# Patient Record
Sex: Male | Born: 1985 | Race: White | Hispanic: No | Marital: Single | Smoking: Current every day smoker
Health system: Southern US, Community
[De-identification: ages and names within clinical notes are randomized; demographics above are authoritative.]

## PROBLEM LIST (undated history)

## (undated) DIAGNOSIS — F1910 Other psychoactive substance abuse, uncomplicated: Secondary | ICD-10-CM

## (undated) DIAGNOSIS — F4310 Post-traumatic stress disorder, unspecified: Secondary | ICD-10-CM

## (undated) DIAGNOSIS — W3400XA Accidental discharge from unspecified firearms or gun, initial encounter: Secondary | ICD-10-CM

## (undated) DIAGNOSIS — N319 Neuromuscular dysfunction of bladder, unspecified: Secondary | ICD-10-CM

## (undated) DIAGNOSIS — F319 Bipolar disorder, unspecified: Secondary | ICD-10-CM

## (undated) DIAGNOSIS — F419 Anxiety disorder, unspecified: Secondary | ICD-10-CM

## (undated) HISTORY — PX: WRIST SURGERY: SHX841

## (undated) HISTORY — PX: HERNIA REPAIR: SHX51

## (undated) HISTORY — PX: CYSTOSCOPY: SUR368

---
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
## 2007-02-21 ENCOUNTER — Emergency Department: Payer: Self-pay | Admitting: Emergency Medicine

## 2007-04-16 ENCOUNTER — Emergency Department: Payer: Self-pay | Admitting: Emergency Medicine

## 2007-06-08 ENCOUNTER — Emergency Department: Payer: Self-pay | Admitting: Emergency Medicine

## 2007-08-02 ENCOUNTER — Emergency Department: Payer: Self-pay | Admitting: Internal Medicine

## 2007-09-24 ENCOUNTER — Other Ambulatory Visit: Payer: Self-pay

## 2007-09-24 ENCOUNTER — Emergency Department: Payer: Self-pay | Admitting: Emergency Medicine

## 2007-11-13 ENCOUNTER — Emergency Department: Payer: Self-pay | Admitting: Emergency Medicine

## 2007-11-18 ENCOUNTER — Emergency Department: Payer: Self-pay | Admitting: Emergency Medicine

## 2008-01-25 ENCOUNTER — Other Ambulatory Visit: Payer: Self-pay

## 2008-01-25 ENCOUNTER — Emergency Department: Payer: Self-pay | Admitting: Emergency Medicine

## 2008-03-06 ENCOUNTER — Emergency Department: Payer: Self-pay | Admitting: Emergency Medicine

## 2008-03-16 ENCOUNTER — Emergency Department: Payer: Self-pay | Admitting: Emergency Medicine

## 2008-03-19 ENCOUNTER — Emergency Department: Payer: Self-pay | Admitting: Emergency Medicine

## 2008-04-02 ENCOUNTER — Emergency Department: Payer: Self-pay | Admitting: Emergency Medicine

## 2010-02-16 ENCOUNTER — Emergency Department: Payer: Self-pay | Admitting: Emergency Medicine

## 2010-04-24 ENCOUNTER — Emergency Department: Payer: Self-pay | Admitting: Emergency Medicine

## 2010-04-30 ENCOUNTER — Ambulatory Visit: Payer: Self-pay | Admitting: Urology

## 2010-05-08 ENCOUNTER — Emergency Department: Payer: Self-pay | Admitting: Emergency Medicine

## 2010-05-09 ENCOUNTER — Ambulatory Visit: Payer: Self-pay | Admitting: Emergency Medicine

## 2010-06-28 ENCOUNTER — Emergency Department: Payer: Self-pay | Admitting: Emergency Medicine

## 2010-07-03 ENCOUNTER — Emergency Department: Payer: Self-pay | Admitting: Emergency Medicine

## 2010-07-07 ENCOUNTER — Emergency Department: Payer: Self-pay | Admitting: Emergency Medicine

## 2010-07-16 ENCOUNTER — Ambulatory Visit: Payer: Self-pay | Admitting: Urology

## 2010-07-19 ENCOUNTER — Emergency Department: Payer: Self-pay | Admitting: Emergency Medicine

## 2010-08-05 ENCOUNTER — Emergency Department: Payer: Self-pay | Admitting: Emergency Medicine

## 2010-08-11 ENCOUNTER — Emergency Department: Payer: Self-pay | Admitting: Emergency Medicine

## 2010-08-17 ENCOUNTER — Emergency Department: Payer: Self-pay | Admitting: Emergency Medicine

## 2010-08-24 ENCOUNTER — Emergency Department: Payer: Self-pay | Admitting: Emergency Medicine

## 2010-09-03 ENCOUNTER — Emergency Department: Payer: Self-pay | Admitting: Internal Medicine

## 2010-09-09 ENCOUNTER — Emergency Department: Payer: Self-pay | Admitting: Emergency Medicine

## 2010-09-17 ENCOUNTER — Ambulatory Visit: Payer: Self-pay | Admitting: Urology

## 2010-09-23 ENCOUNTER — Emergency Department: Payer: Self-pay | Admitting: Emergency Medicine

## 2011-01-10 ENCOUNTER — Emergency Department: Payer: Self-pay | Admitting: Emergency Medicine

## 2011-01-17 ENCOUNTER — Emergency Department: Payer: Self-pay | Admitting: Emergency Medicine

## 2011-02-03 ENCOUNTER — Emergency Department: Payer: Self-pay | Admitting: Unknown Physician Specialty

## 2011-02-10 ENCOUNTER — Emergency Department: Payer: Self-pay | Admitting: *Deleted

## 2011-02-14 ENCOUNTER — Emergency Department: Payer: Self-pay | Admitting: Emergency Medicine

## 2011-02-18 ENCOUNTER — Ambulatory Visit: Payer: Self-pay | Admitting: Unknown Physician Specialty

## 2011-06-26 ENCOUNTER — Emergency Department: Payer: Self-pay | Admitting: Emergency Medicine

## 2011-10-13 ENCOUNTER — Emergency Department: Payer: Self-pay | Admitting: Emergency Medicine

## 2011-12-10 ENCOUNTER — Emergency Department: Payer: Self-pay | Admitting: Emergency Medicine

## 2011-12-11 LAB — URINALYSIS, COMPLETE

## 2011-12-12 ENCOUNTER — Inpatient Hospital Stay: Payer: Self-pay | Admitting: Psychiatry

## 2011-12-12 LAB — CBC
HGB: 14.3 g/dL (ref 13.0–18.0)
MCH: 28.5 pg (ref 26.0–34.0)
Platelet: 236 10*3/uL (ref 150–440)
RBC: 5.02 10*6/uL (ref 4.40–5.90)

## 2011-12-12 LAB — URINALYSIS, COMPLETE
Glucose,UR: NEGATIVE mg/dL (ref 0–75)
Leukocyte Esterase: NEGATIVE
Ph: 6 (ref 4.5–8.0)
Specific Gravity: 1.03 (ref 1.003–1.030)
WBC UR: NONE SEEN /HPF (ref 0–5)

## 2011-12-12 LAB — COMPREHENSIVE METABOLIC PANEL
Alkaline Phosphatase: 78 U/L (ref 50–136)
BUN: 13 mg/dL (ref 7–18)
Bilirubin,Total: 0.7 mg/dL (ref 0.2–1.0)
Calcium, Total: 8.6 mg/dL (ref 8.5–10.1)
Chloride: 107 mmol/L (ref 98–107)
Creatinine: 0.91 mg/dL (ref 0.60–1.30)
Potassium: 4 mmol/L (ref 3.5–5.1)
SGPT (ALT): 276 U/L — ABNORMAL HIGH

## 2011-12-12 LAB — ETHANOL: Ethanol: <3 mg/dL

## 2011-12-12 LAB — DRUG SCREEN, URINE
Cannabinoid 50 Ng, Ur ~~LOC~~: POSITIVE (ref ?–50)
Tricyclic, Ur Screen: NEGATIVE (ref ?–1000)

## 2011-12-12 LAB — SALICYLATE LEVEL: Salicylates, Serum: 2.9 mg/dL — ABNORMAL HIGH

## 2011-12-31 ENCOUNTER — Emergency Department: Payer: Self-pay | Admitting: *Deleted

## 2012-01-02 ENCOUNTER — Emergency Department (HOSPITAL_COMMUNITY)
Admission: EM | Admit: 2012-01-02 | Discharge: 2012-01-02 | Disposition: A | Discharge status: Home / Self Care | Payer: Self-pay | Attending: Emergency Medicine | Admitting: Emergency Medicine

## 2012-01-02 ENCOUNTER — Encounter (HOSPITAL_COMMUNITY): Payer: Self-pay | Admitting: Emergency Medicine

## 2012-01-02 ENCOUNTER — Emergency Department (HOSPITAL_COMMUNITY): Payer: Self-pay

## 2012-01-02 DIAGNOSIS — IMO0002 Reserved for concepts with insufficient information to code with codable children: Secondary | ICD-10-CM | POA: Insufficient documentation

## 2012-01-02 DIAGNOSIS — R110 Nausea: Secondary | ICD-10-CM | POA: Insufficient documentation

## 2012-01-02 DIAGNOSIS — S301XXA Contusion of abdominal wall, initial encounter: Secondary | ICD-10-CM | POA: Insufficient documentation

## 2012-01-02 DIAGNOSIS — R1011 Right upper quadrant pain: Secondary | ICD-10-CM | POA: Insufficient documentation

## 2012-01-02 LAB — COMPREHENSIVE METABOLIC PANEL
ALT: 421 U/L — ABNORMAL HIGH (ref 0–53)
Alkaline Phosphatase: 95 U/L (ref 39–117)
BUN: 12 mg/dL (ref 6–23)
CO2: 21 mEq/L (ref 19–32)
GFR calc Af Amer: >90 mL/min (ref >90)
GFR calc non Af Amer: >90 mL/min (ref >90)
Glucose, Bld: 103 mg/dL — ABNORMAL HIGH (ref 70–99)
Potassium: 4.3 mEq/L (ref 3.5–5.1)
Sodium: 139 mEq/L (ref 135–145)

## 2012-01-02 LAB — URINALYSIS, ROUTINE W REFLEX MICROSCOPIC
Bilirubin Urine: NEGATIVE
Ketones, ur: NEGATIVE mg/dL
Nitrite: NEGATIVE
Protein, ur: NEGATIVE mg/dL
Urobilinogen, UA: 0.2 mg/dL (ref 0.0–1.0)

## 2012-01-02 LAB — RAPID URINE DRUG SCREEN, HOSP PERFORMED: Barbiturates: NOT DETECTED

## 2012-01-02 LAB — DIFFERENTIAL
Eosinophils Relative: 1 % (ref 0–5)
Lymphocytes Relative: 20 % (ref 12–46)
Lymphs Abs: 2.1 10*3/uL (ref 0.7–4.0)
Monocytes Relative: 8 % (ref 3–12)
Neutrophils Relative %: 70 % (ref 43–77)

## 2012-01-02 LAB — CBC
Hemoglobin: 14.7 g/dL (ref 13.0–17.0)
MCH: 30.8 pg (ref 26.0–34.0)
MCV: 87 fL (ref 78.0–100.0)
Platelets: 232 10*3/uL (ref 150–400)
RBC: 4.78 MIL/uL (ref 4.22–5.81)
WBC: 10.3 10*3/uL (ref 4.0–10.5)

## 2012-01-02 LAB — URINE MICROSCOPIC-ADD ON

## 2012-01-02 MED ORDER — SODIUM CHLORIDE 0.9 % IV SOLN
Freq: Once | INTRAVENOUS | Status: AC
  Administered 2012-01-02: 04:00:00 via INTRAVENOUS

## 2012-01-02 MED ORDER — IOHEXOL 300 MG/ML  SOLN
100.0000 mL | Freq: Once | INTRAMUSCULAR | Status: AC | PRN
  Administered 2012-01-02: 100 mL via INTRAVENOUS

## 2012-01-02 MED ORDER — HYDROMORPHONE HCL PF 1 MG/ML IJ SOLN
1.0000 mg | Freq: Once | INTRAMUSCULAR | Status: AC
  Administered 2012-01-02: 1 mg via INTRAVENOUS
  Filled 2012-01-02: qty 1

## 2012-01-02 NOTE — ED Notes (Signed)
Patient transported to CT 

## 2012-01-02 NOTE — ED Notes (Signed)
Patient is resting comfortably. 

## 2012-01-02 NOTE — ED Notes (Signed)
PT. REPORTS KICKED BY A BULL THIS MORNING WHILE FEEDING THEM , PRESENTS WITH PAIN AT RUQ/ RIGHT BACK PAIN WITH HEMATURIA.

## 2012-01-02 NOTE — ED Notes (Signed)
Pt ambulated with a steady gait; VSS; A&Ox3; no signs of distress; pt instructed to wait in waiting room for 4 hours until he is able to drive; respirations even and unlabored; skin warm and dry. NO questions at this time.

## 2012-01-02 NOTE — Discharge Instructions (Signed)
Blunt Abdominal Trauma  A blunt injury to the abdomen can cause pain. The pain is most likely from bruising and stretching of your muscles. This pain is often made worse with movement. Most often these injuries are not serious and get better within 1 week with rest and mild pain medicine. However, internal organs (liver, spleen, kidneys) can be injured with blunt trauma. If you do not get better or if you get worse, further examination may be needed.  Continue with your regular daily activities, but avoid any strenuous activities until your pain is improved. If your stomach is upset, stick to a clear liquid diet and slowly advance to solid food.   SEEK IMMEDIATE MEDICAL CARE IF:    You develop increasing pain, nausea, or repeated vomiting.   You develop chest pain or breathing difficulty.   You develop blood in the urine, vomit, or stool.   You develop weakness, fainting, fever, or other serious complaints.  Document Released: 08/14/2004 Document Revised: 06/26/2011 Document Reviewed: 11/30/2008  [employer] Patient Information 2012 [employer].

## 2012-01-02 NOTE — ED Provider Notes (Addendum)
History     CSN: [id number]  Arrival date & time 01/02/12  3664   First MD Initiated Contact with Patient 01/02/12 0354      Chief Complaint  Patient presents with  . Abdominal Pain    (Consider location/radiation/quality/duration/timing/severity/associated sxs/prior treatment) HPI Comments: Patient is a [employer].  States he was transporting a load of cattle and stopped to feed them when a bull kicked him and pinned him between it and a stablizer bar.  He has been having pain in the right side of the abdomen and right flank and urinating blood since that time.  Patient is a 26 y.o. male presenting with abdominal pain. The history is provided by the patient.  Abdominal Pain The primary symptoms of the illness include abdominal pain and nausea. The primary symptoms of the illness do not include vomiting. Primary symptoms comment: abd/flank pain.  Kicked by bull. The current episode started less than 1 hour ago. The onset of the illness was sudden. The problem has been gradually worsening.  The patient has not had a change in bowel habit.    History reviewed. No pertinent past medical history.  History reviewed. No pertinent past surgical history.  No family history on file.  History  Substance Use Topics  . Smoking status: Current Everyday Smoker  . Smokeless tobacco: Not on file  . Alcohol Use: No      Review of Systems  Gastrointestinal: Positive for nausea and abdominal pain. Negative for vomiting.  All other systems reviewed and are negative.    Allergies  Review of patient's allergies indicates not on file.  Home Medications  No current outpatient prescriptions on file.  BP 124/82  Pulse 122  Temp 98.2 F (36.8 C) (Oral)  Resp 18  SpO2 99%  Physical Exam  Nursing note and vitals reviewed. Constitutional: He appears well-developed and well-nourished.  HENT:  Head: Normocephalic and atraumatic.  Neck: Normal range of motion. Neck supple.    Cardiovascular: Normal rate and regular rhythm.   No murmur heard. Pulmonary/Chest: Effort normal. No respiratory distress. He has no wheezes.  Abdominal: Soft. Bowel sounds are normal. There is tenderness.       ttp in the right mid abdomen and flank.  Musculoskeletal: Normal range of motion. He exhibits no edema.  Neurological: He is alert.  Skin: Skin is warm and dry.    ED Course  Procedures (including critical care time)   Labs Reviewed  CBC  DIFFERENTIAL  COMPREHENSIVE METABOLIC PANEL  URINALYSIS, ROUTINE W REFLEX MICROSCOPIC   No results found.   No diagnosis found.    MDM  The patient presents after a "kick from a bull".  He is having pain in the abdomen and flank and reports that he is urinating blood.  He reports severe pain and was given a dose of dilaudid for pain.  On his exam there is no evidence of any trauma and on the ct there is no reason for the pain or blood in urine he is reporting.  It seems to me that if he had been struck in the abdomen with a bull with enough force to cause hematuria, there should at least be an abrasion or contusion which he does not have.  I strongly suspect he is not being forthright with me and that the blood in the urine is factitious.  He will be discharged to home with ibuprofen or tylenol for pain, to return prn.  Geoffery Lyons, MD 01/02/12 4540  Geoffery Lyons, MD 01/02/12 [phone]

## 2012-01-02 NOTE — ED Notes (Signed)
CT scan called re delay in scan; reported they were waiting on labs to give contrast.

## 2012-01-12 ENCOUNTER — Emergency Department: Payer: Self-pay | Admitting: Emergency Medicine

## 2012-03-11 ENCOUNTER — Emergency Department: Payer: Self-pay | Admitting: *Deleted

## 2012-06-01 ENCOUNTER — Emergency Department: Payer: Self-pay | Admitting: Emergency Medicine

## 2012-06-02 LAB — BASIC METABOLIC PANEL
EGFR (Non-African Amer.): >60
Glucose: 91 mg/dL (ref 65–99)
Osmolality: 272 (ref 275–301)
Potassium: 3.5 mmol/L (ref 3.5–5.1)

## 2012-06-02 LAB — CBC
HCT: 40 % (ref 40.0–52.0)
HGB: 13.4 g/dL (ref 13.0–18.0)
MCHC: 33.6 g/dL (ref 32.0–36.0)
RBC: 4.75 10*6/uL (ref 4.40–5.90)
WBC: 9.6 10*3/uL (ref 3.8–10.6)

## 2012-06-12 ENCOUNTER — Emergency Department: Payer: Self-pay | Admitting: Emergency Medicine

## 2012-06-13 LAB — BASIC METABOLIC PANEL
BUN: 9 mg/dL (ref 7–18)
EGFR (African American): >60
EGFR (Non-African Amer.): >60
Glucose: 89 mg/dL (ref 65–99)
Osmolality: 268 (ref 275–301)
Potassium: 3.2 mmol/L — ABNORMAL LOW (ref 3.5–5.1)

## 2012-06-13 LAB — CBC
HCT: 41.2 % (ref 40.0–52.0)
RDW: 14.1 % (ref 11.5–14.5)
WBC: 8.8 10*3/uL (ref 3.8–10.6)

## 2012-06-29 ENCOUNTER — Emergency Department: Payer: Self-pay | Admitting: Emergency Medicine

## 2013-02-15 ENCOUNTER — Emergency Department: Payer: Self-pay | Admitting: Emergency Medicine

## 2013-02-16 LAB — BASIC METABOLIC PANEL
Anion Gap: 5 — ABNORMAL LOW (ref 7–16)
BUN: 12 mg/dL (ref 7–18)
Co2: 29 mmol/L (ref 21–32)
EGFR (African American): >60
EGFR (Non-African Amer.): >60
Osmolality: 278 (ref 275–301)
Potassium: 3.6 mmol/L (ref 3.5–5.1)

## 2013-02-16 LAB — CBC WITH DIFFERENTIAL/PLATELET
Basophil #: 0.1 10*3/uL (ref 0.0–0.1)
Eosinophil %: 0.5 %
HGB: 14.7 g/dL (ref 13.0–18.0)
Lymphocyte #: 3.8 10*3/uL — ABNORMAL HIGH (ref 1.0–3.6)
Lymphocyte %: 34.2 %
MCHC: 35.2 g/dL (ref 32.0–36.0)
MCV: 87 fL (ref 80–100)
Monocyte %: 8.1 %
Neutrophil #: 6.3 10*3/uL (ref 1.4–6.5)
Platelet: 206 10*3/uL (ref 150–440)
RDW: 13.5 % (ref 11.5–14.5)
WBC: 11.2 10*3/uL — ABNORMAL HIGH (ref 3.8–10.6)

## 2013-02-21 LAB — CULTURE, BLOOD (SINGLE)

## 2013-03-26 ENCOUNTER — Emergency Department: Payer: Self-pay | Admitting: Emergency Medicine

## 2013-03-26 LAB — CBC WITH DIFFERENTIAL/PLATELET
Basophil #: 0.1 10*3/uL (ref 0.0–0.1)
Basophil %: 0.9 %
Eosinophil #: 0.2 10*3/uL (ref 0.0–0.7)
Eosinophil %: 1.9 %
HGB: 15.3 g/dL (ref 13.0–18.0)
Lymphocyte #: 3.9 10*3/uL — ABNORMAL HIGH (ref 1.0–3.6)
Lymphocyte %: 40 %
MCH: 30.8 pg (ref 26.0–34.0)
MCHC: 35.6 g/dL (ref 32.0–36.0)
MCV: 87 fL (ref 80–100)
Monocyte %: 9.1 %
Neutrophil #: 4.6 10*3/uL (ref 1.4–6.5)
Platelet: 212 10*3/uL (ref 150–440)
WBC: 9.6 10*3/uL (ref 3.8–10.6)

## 2013-03-26 LAB — COMPREHENSIVE METABOLIC PANEL
Albumin: 4.2 g/dL (ref 3.4–5.0)
Alkaline Phosphatase: 95 U/L (ref 50–136)
Anion Gap: 4 — ABNORMAL LOW (ref 7–16)
Bilirubin,Total: 0.4 mg/dL (ref 0.2–1.0)
Calcium, Total: 9.1 mg/dL (ref 8.5–10.1)
Chloride: 106 mmol/L (ref 98–107)
Glucose: 89 mg/dL (ref 65–99)
Potassium: 3.7 mmol/L (ref 3.5–5.1)
Sodium: 139 mmol/L (ref 136–145)

## 2013-05-23 IMAGING — US US PELVIS LIMITED
1 series · 17 of 24 positions shown · non-contrast
Comparison: none

REASON FOR EXAM: Possible catheter broke off in bladder approx 2 hr ago
COMMENTS:   LMP: (Male)

[Series 1: us pelvis limited · 17 of 24 slices shown]
[im 1/24]
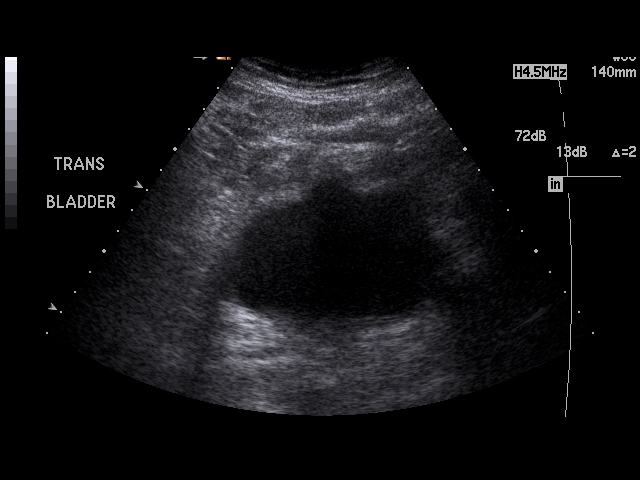
[im 3/24]
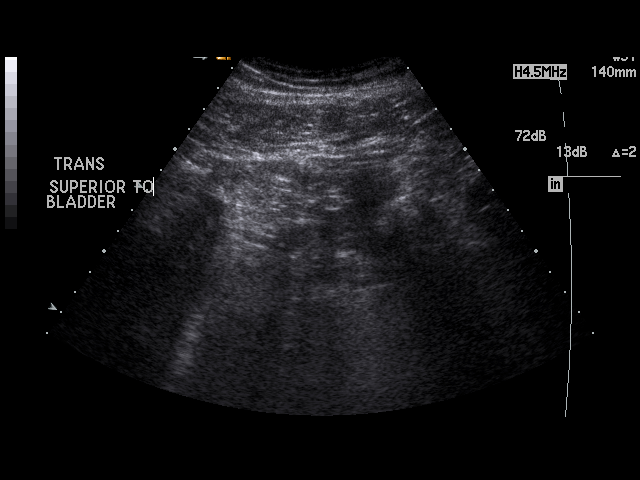
[im 4/24]
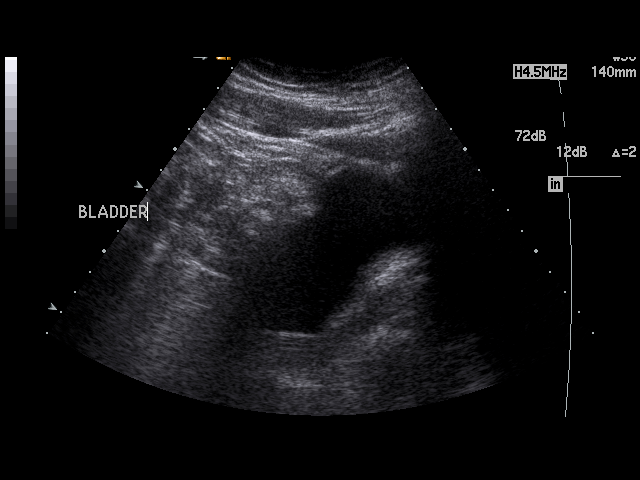
[im 5/24]
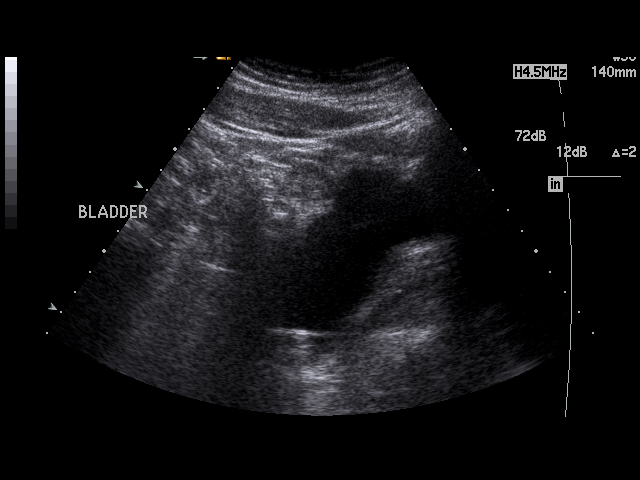
[im 7/24]
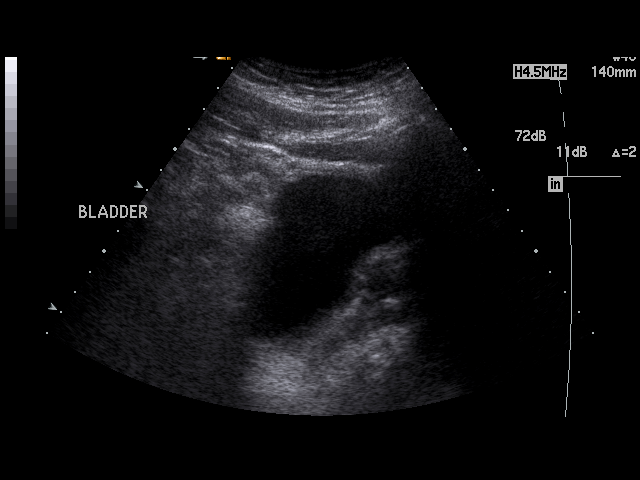
[im 8/24]
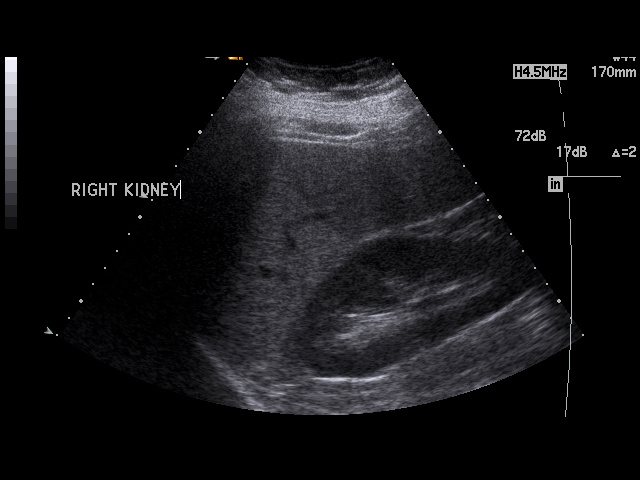
[im 10/24]
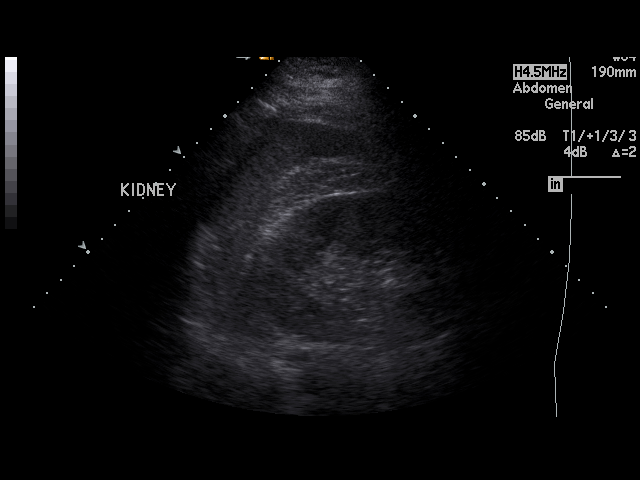
[im 11/24]
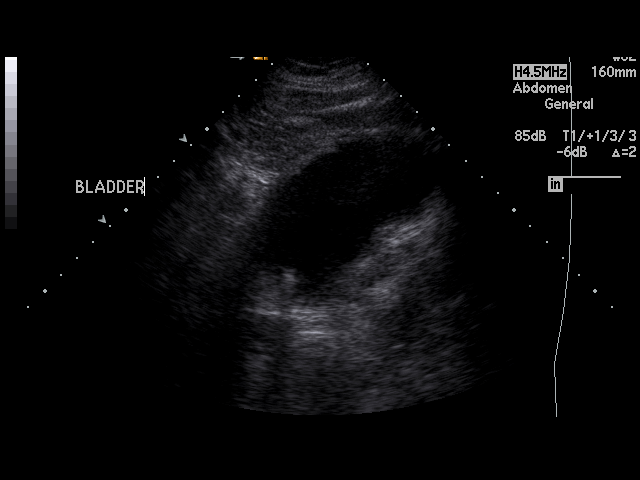
[im 13/24]
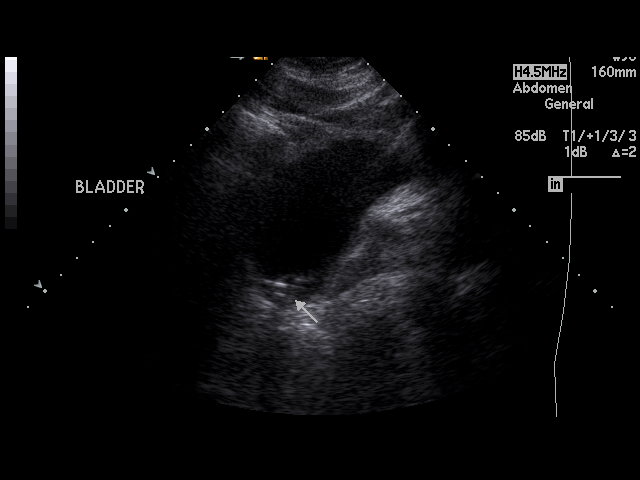
[im 14/24]
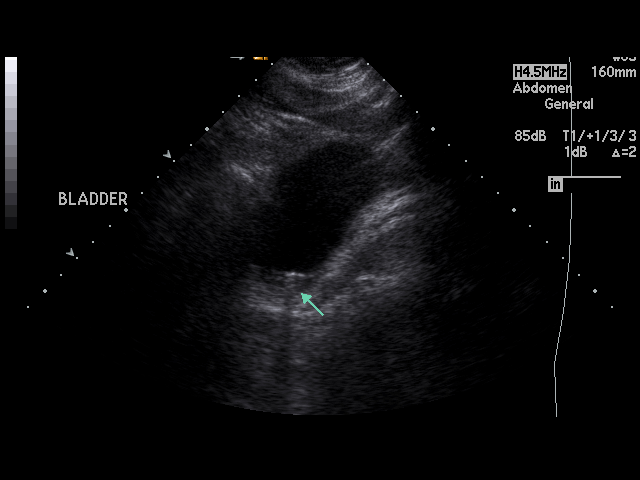
[im 15/24]
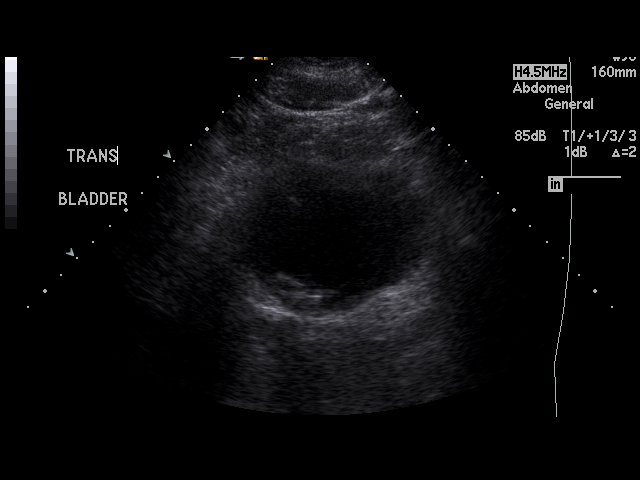
[im 17/24]
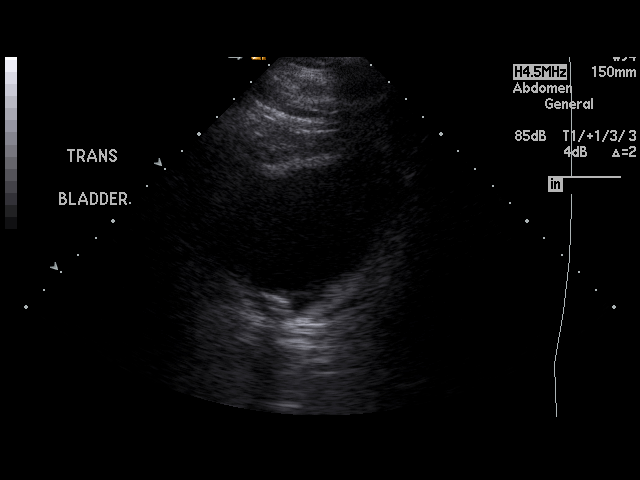
[im 18/24]
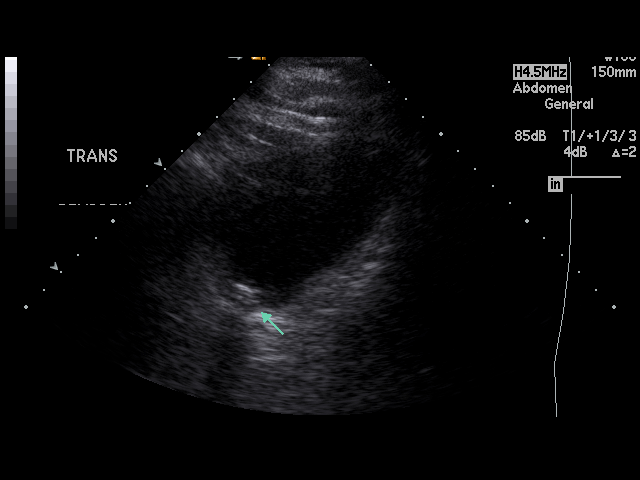
[im 20/24]
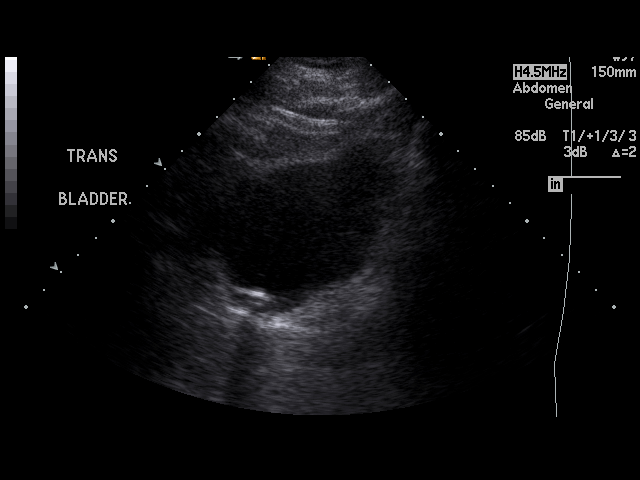
[im 21/24]
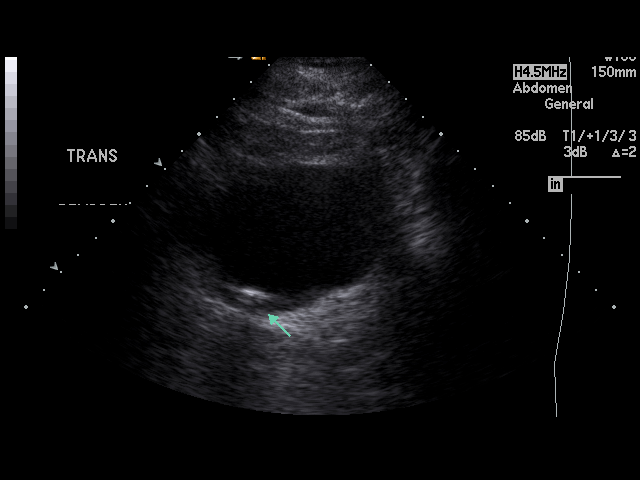
[im 22/24]
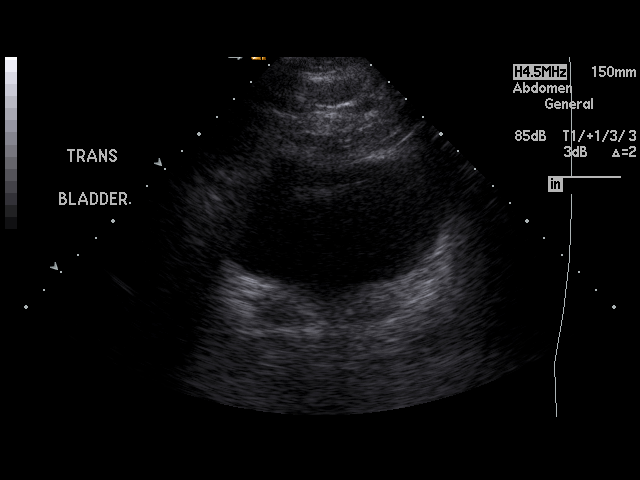
[im 24/24]
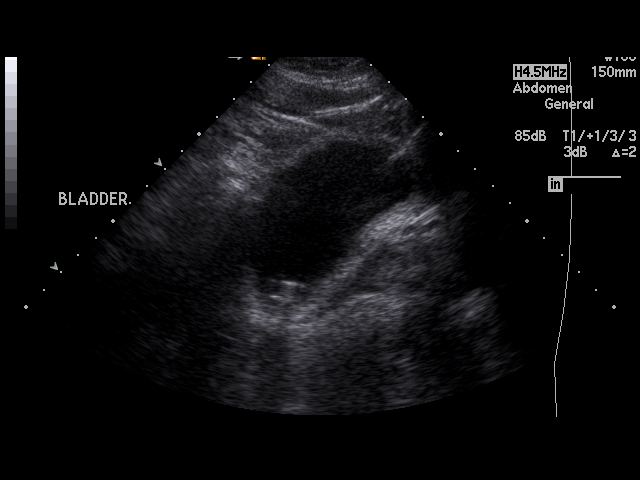

[17 of 24 positions shown; findings below may reference images not displayed]

PROCEDURE:     US  - US URINARY BLADDER  - January 10, 2011  [DATE]

RESULT:

Limited evaluation of the urinary bladder demonstrates a linearly oriented
echogenic focus within the base of the bladder. This finding is concerning
for a foreign body. Acoustic shadowing is associated with this finding. The
bladder is otherwise partially distended. No further sonographic
abnormalities are appreciated within the bladder. Limited evaluation of the
left kidney is unremarkable.
IMPRESSION: 1.  Sonographic findings consistent with an echogenic shadowing focus in the
dependent portion of the bladder concerning for a foreign body. This appears
to be consistent with the patient's history of a possible fractured catheter
within the bladder.
2.  Dr. Gary of the Emergency Department was informed of these findings
via a preliminary faxed report.

## 2013-05-23 IMAGING — CR DG ABDOMEN 1V
1 series · 2 of 2 positions shown · non-contrast
Comparison: none

REASON FOR EXAM: states catheter tip broke off
COMMENTS:

[Series 1: view not recorded · 0.17mm/px · 2 of 2 slices shown]
[im 1/2]
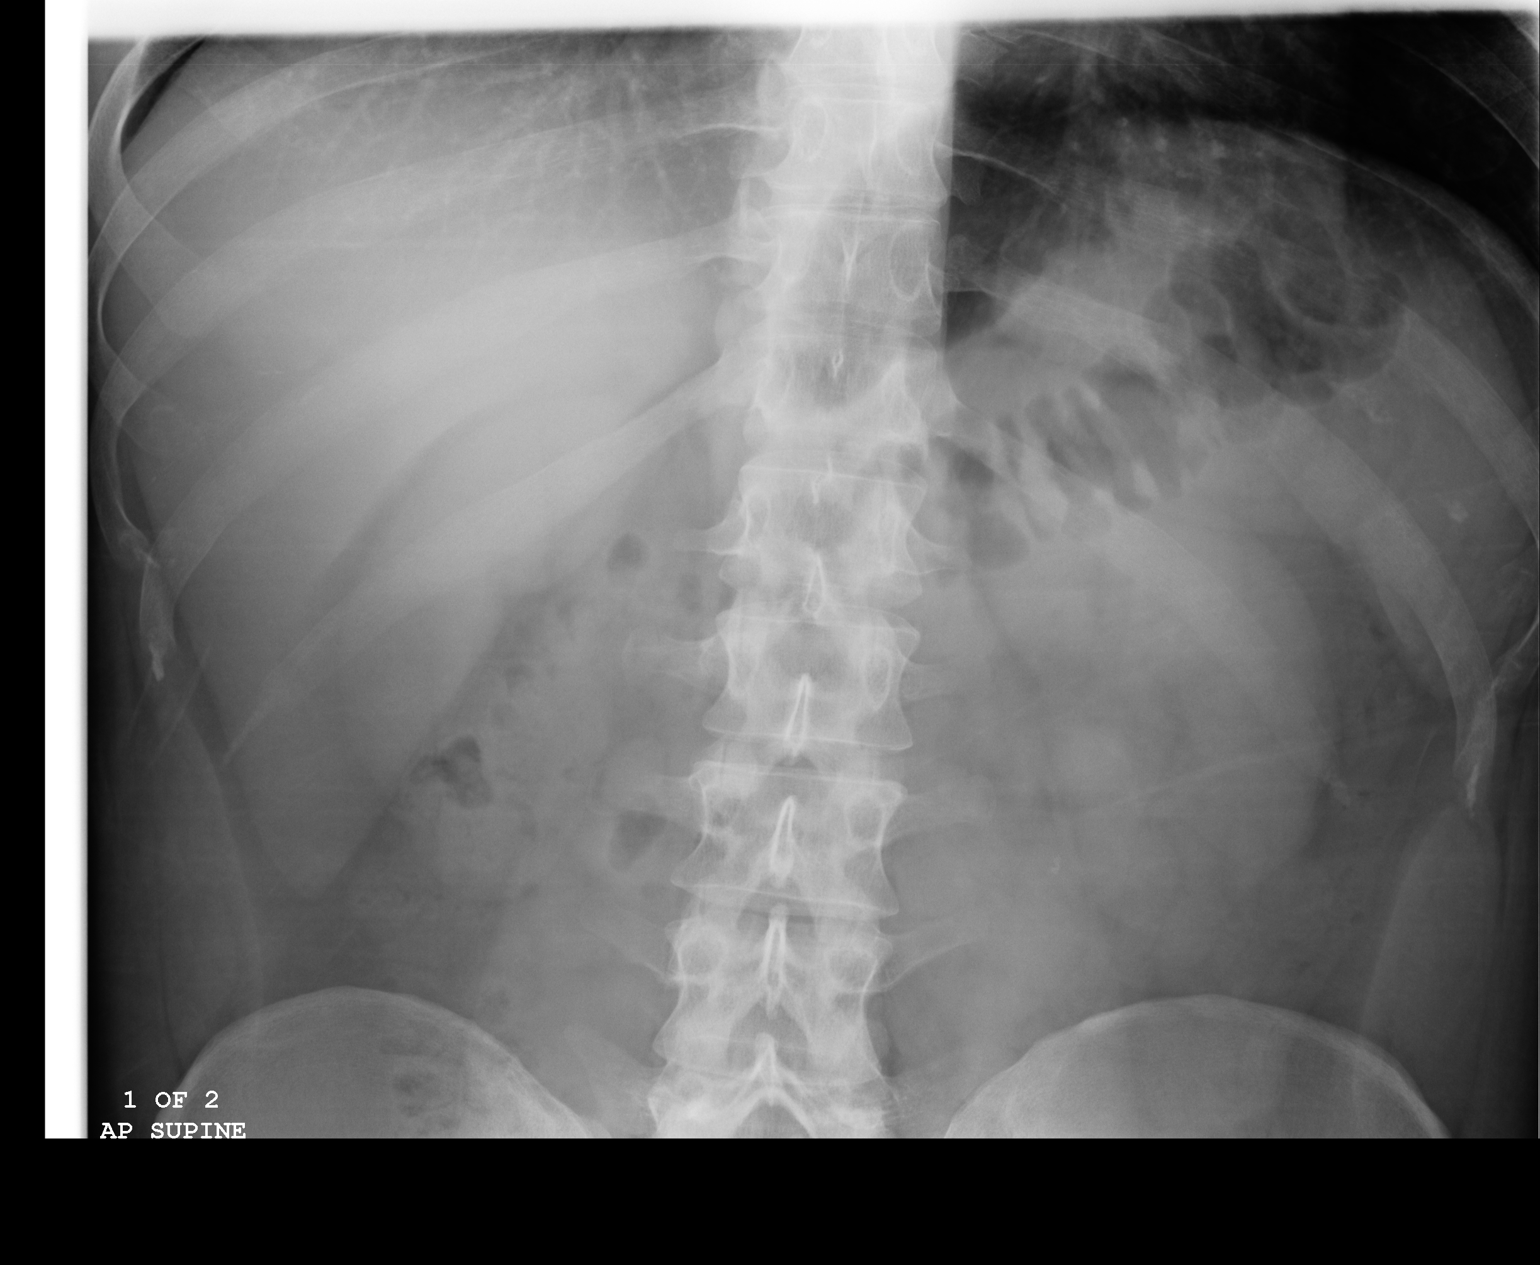
[im 2/2]
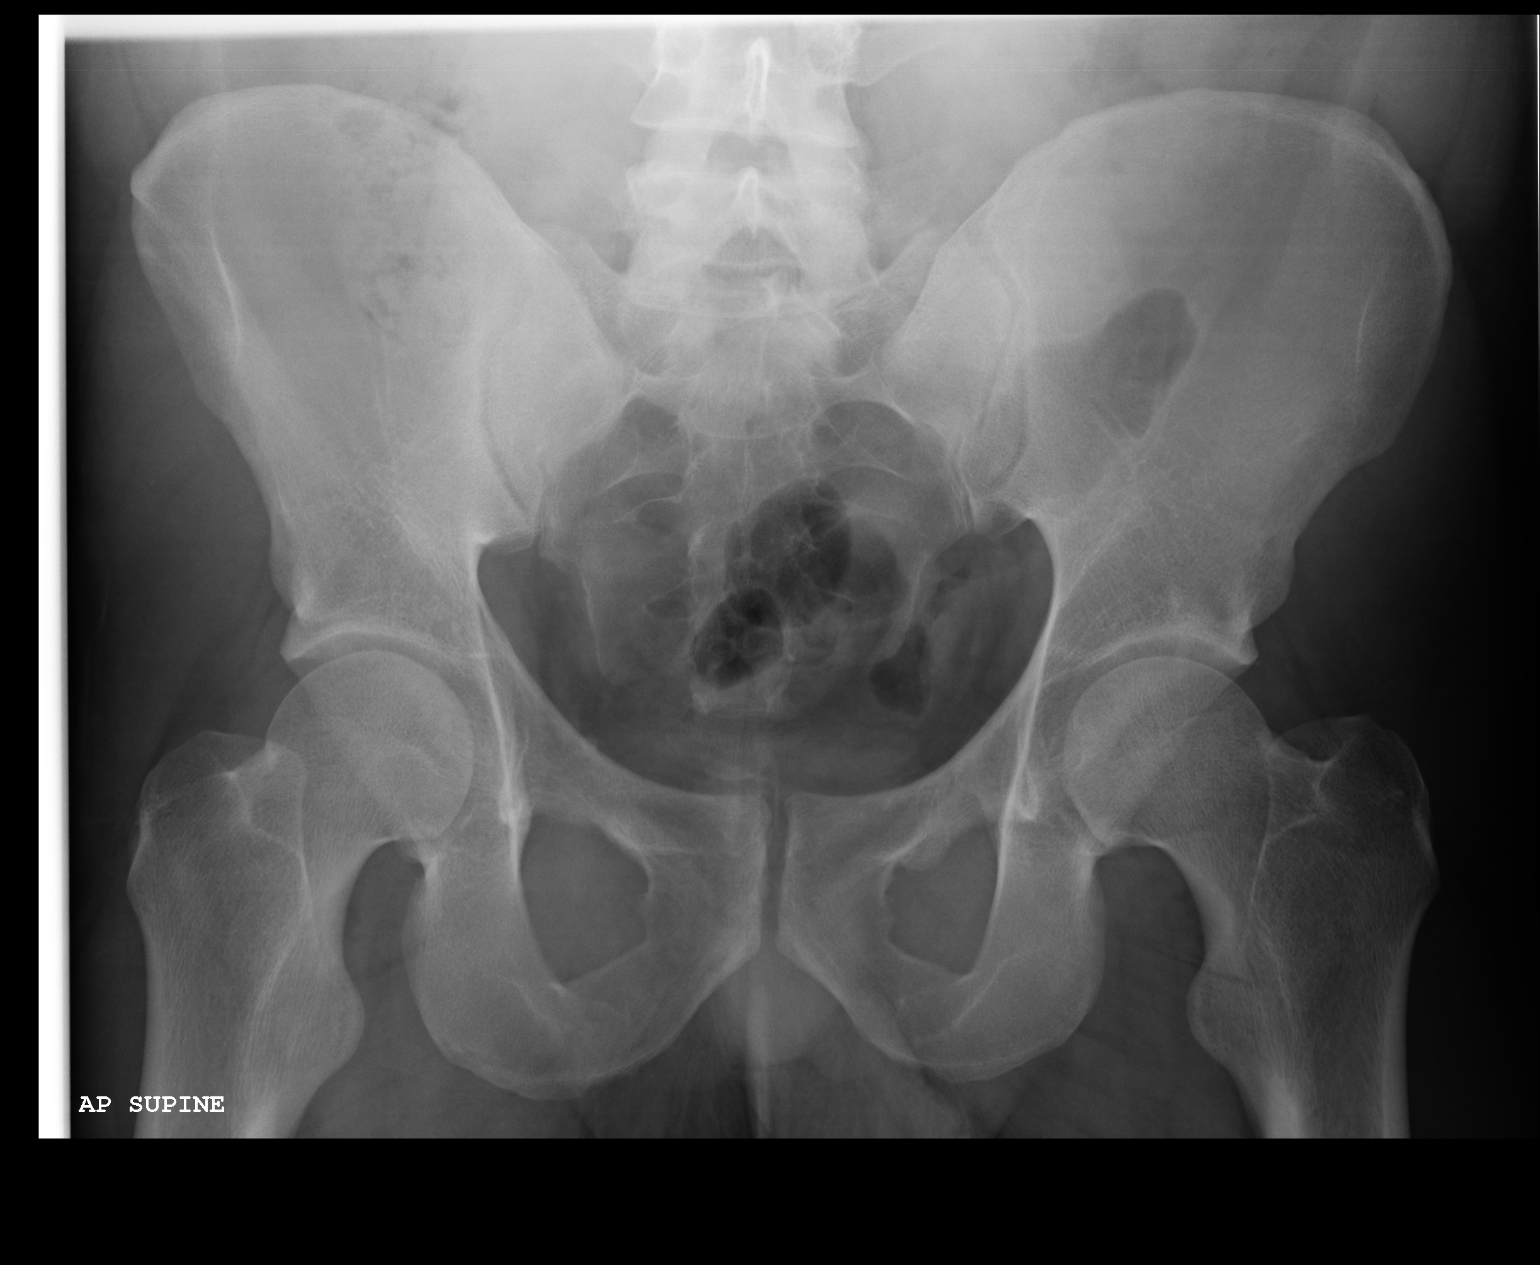

[2 of 2 positions shown; findings below may reference images not displayed]

PROCEDURE:     DXR - DXR KIDNEY URETER BLADDER  - January 10, 2011  [DATE]

RESULT:     An AP view of the abdomen shows a normal bowel gas pattern. No
definitely abnormal intra-abdominal calcifications are seen. A few
phleboliths are noted in the pelvis. There is a 2.2 cm, very faint tubular
structure projected over the pelvis at the level of the upper portion of the
pubic symphysis. The nature of this density is not certain but a catheter
fragment cannot be excluded. No other significant abnormalities are
identified. The osseous structures are normal in appearance.
IMPRESSION: Possible catheter fragment projected at and above the
superior aspect of the pubic symphysis.

## 2013-05-24 ENCOUNTER — Inpatient Hospital Stay: Payer: Self-pay | Admitting: Internal Medicine

## 2013-05-24 LAB — COMPREHENSIVE METABOLIC PANEL
Alkaline Phosphatase: 89 U/L (ref 50–136)
Anion Gap: 4 — ABNORMAL LOW (ref 7–16)
BUN: 11 mg/dL (ref 7–18)
Bilirubin,Total: 0.6 mg/dL (ref 0.2–1.0)
Calcium, Total: 9.1 mg/dL (ref 8.5–10.1)
Creatinine: 0.8 mg/dL (ref 0.60–1.30)
EGFR (Non-African Amer.): >60
SGPT (ALT): 115 U/L — ABNORMAL HIGH (ref 12–78)
Sodium: 132 mmol/L — ABNORMAL LOW (ref 136–145)

## 2013-05-24 LAB — URINALYSIS, COMPLETE
Bilirubin,UR: NEGATIVE
Blood: NEGATIVE
Leukocyte Esterase: NEGATIVE
RBC,UR: NONE SEEN /HPF (ref 0–5)
Specific Gravity: 1.023 (ref 1.003–1.030)
Squamous Epithelial: NONE SEEN

## 2013-05-24 LAB — CBC
HCT: 42.7 % (ref 40.0–52.0)
Platelet: 198 10*3/uL (ref 150–440)
RBC: 4.89 10*6/uL (ref 4.40–5.90)
RDW: 13.9 % (ref 11.5–14.5)

## 2013-05-24 LAB — DRUG SCREEN, URINE
Barbiturates, Ur Screen: NEGATIVE (ref ?–200)
Cocaine Metabolite,Ur ~~LOC~~: POSITIVE (ref ?–300)
Opiate, Ur Screen: POSITIVE (ref ?–300)
Phencyclidine (PCP) Ur S: NEGATIVE (ref ?–25)
Tricyclic, Ur Screen: NEGATIVE (ref ?–1000)

## 2013-05-25 LAB — DRUG SCREEN, URINE
Amphetamines, Ur Screen: NEGATIVE (ref ?–1000)
Cannabinoid 50 Ng, Ur ~~LOC~~: POSITIVE (ref ?–50)
Cocaine Metabolite,Ur ~~LOC~~: POSITIVE (ref ?–300)
MDMA (Ecstasy)Ur Screen: NEGATIVE (ref ?–500)
Opiate, Ur Screen: POSITIVE (ref ?–300)

## 2013-05-25 LAB — BASIC METABOLIC PANEL
BUN: 15 mg/dL (ref 7–18)
Calcium, Total: 9.1 mg/dL (ref 8.5–10.1)
Co2: 30 mmol/L (ref 21–32)
Creatinine: 0.94 mg/dL (ref 0.60–1.30)
EGFR (Non-African Amer.): >60
Osmolality: 272 (ref 275–301)
Potassium: 4.2 mmol/L (ref 3.5–5.1)

## 2013-05-25 LAB — URINE CULTURE

## 2013-05-25 LAB — CBC WITH DIFFERENTIAL/PLATELET
HCT: 43 % (ref 40.0–52.0)
MCH: 30.8 pg (ref 26.0–34.0)
MCHC: 35.3 g/dL (ref 32.0–36.0)
Monocyte #: 1 x10 3/mm (ref 0.2–1.0)
RBC: 4.94 10*6/uL (ref 4.40–5.90)
WBC: 8.2 10*3/uL (ref 3.8–10.6)

## 2013-05-25 LAB — VANCOMYCIN, TROUGH: Vancomycin, Trough: 8 ug/mL — ABNORMAL LOW (ref 10–20)

## 2013-05-26 LAB — DRUG SCREEN, URINE
Amphetamines, Ur Screen: NEGATIVE (ref ?–1000)
Barbiturates, Ur Screen: NEGATIVE (ref ?–200)
Benzodiazepine, Ur Scrn: POSITIVE (ref ?–200)
Cannabinoid 50 Ng, Ur ~~LOC~~: POSITIVE (ref ?–50)
Cocaine Metabolite,Ur ~~LOC~~: POSITIVE (ref ?–300)
Methadone, Ur Screen: NEGATIVE (ref ?–300)
Opiate, Ur Screen: POSITIVE (ref ?–300)
Phencyclidine (PCP) Ur S: NEGATIVE (ref ?–25)

## 2013-05-29 LAB — CULTURE, BLOOD (SINGLE)

## 2013-07-23 ENCOUNTER — Emergency Department: Payer: Self-pay | Admitting: Emergency Medicine

## 2013-07-23 LAB — URINALYSIS, COMPLETE
Bacteria: NONE SEEN
Bilirubin,UR: NEGATIVE
Blood: NEGATIVE
GLUCOSE, UR: NEGATIVE mg/dL (ref 0–75)
KETONE: NEGATIVE
Leukocyte Esterase: NEGATIVE
Nitrite: NEGATIVE
PROTEIN: NEGATIVE
Ph: 5 (ref 4.5–8.0)
RBC,UR: 1 /HPF (ref 0–5)
SQUAMOUS EPITHELIAL: NONE SEEN
Specific Gravity: 1.017 (ref 1.003–1.030)

## 2013-07-23 LAB — GC/CHLAMYDIA PROBE AMP

## 2014-01-01 ENCOUNTER — Emergency Department: Payer: Self-pay | Admitting: Internal Medicine

## 2014-01-01 LAB — BASIC METABOLIC PANEL
Anion Gap: 8 (ref 7–16)
BUN: 9 mg/dL (ref 7–18)
CALCIUM: 9.3 mg/dL (ref 8.5–10.1)
CHLORIDE: 112 mmol/L — AB (ref 98–107)
Co2: 21 mmol/L (ref 21–32)
Creatinine: 0.93 mg/dL (ref 0.60–1.30)
EGFR (African American): >60
GLUCOSE: 97 mg/dL (ref 65–99)
Osmolality: 280 (ref 275–301)
POTASSIUM: 3.3 mmol/L — AB (ref 3.5–5.1)
Sodium: 141 mmol/L (ref 136–145)

## 2014-01-01 LAB — CBC
HCT: 42.5 % (ref 40.0–52.0)
HGB: 14.9 g/dL (ref 13.0–18.0)
MCH: 30.3 pg (ref 26.0–34.0)
MCHC: 35 g/dL (ref 32.0–36.0)
MCV: 87 fL (ref 80–100)
PLATELETS: 209 10*3/uL (ref 150–440)
RBC: 4.9 10*6/uL (ref 4.40–5.90)
RDW: 13.4 % (ref 11.5–14.5)
WBC: 10.4 10*3/uL (ref 3.8–10.6)

## 2014-01-01 LAB — TROPONIN I

## 2014-03-13 ENCOUNTER — Emergency Department: Payer: Self-pay | Admitting: Emergency Medicine

## 2014-03-13 LAB — COMPREHENSIVE METABOLIC PANEL
ALK PHOS: 81 U/L
ALT: 96 U/L — AB
ANION GAP: 7 (ref 7–16)
Albumin: 4.2 g/dL (ref 3.4–5.0)
BUN: 10 mg/dL (ref 7–18)
Bilirubin,Total: 0.7 mg/dL (ref 0.2–1.0)
CALCIUM: 8.9 mg/dL (ref 8.5–10.1)
CREATININE: 1.03 mg/dL (ref 0.60–1.30)
Chloride: 101 mmol/L (ref 98–107)
Co2: 28 mmol/L (ref 21–32)
GLUCOSE: 123 mg/dL — AB (ref 65–99)
Osmolality: 272 (ref 275–301)
POTASSIUM: 3.5 mmol/L (ref 3.5–5.1)
SGOT(AST): 50 U/L — ABNORMAL HIGH (ref 15–37)
Sodium: 136 mmol/L (ref 136–145)
TOTAL PROTEIN: 8.1 g/dL (ref 6.4–8.2)

## 2014-03-13 LAB — URINALYSIS, COMPLETE
Blood: NEGATIVE
Glucose,UR: NEGATIVE mg/dL (ref 0–75)
KETONE: NEGATIVE
LEUKOCYTE ESTERASE: NEGATIVE
Nitrite: NEGATIVE
PH: 7 (ref 4.5–8.0)
Protein: 100
Specific Gravity: 1.034 (ref 1.003–1.030)
Squamous Epithelial: NONE SEEN
WBC UR: 5 /HPF (ref 0–5)

## 2014-03-13 LAB — CBC WITH DIFFERENTIAL/PLATELET
BASOS PCT: 0.4 %
Basophil #: 0 10*3/uL (ref 0.0–0.1)
EOS ABS: 0 10*3/uL (ref 0.0–0.7)
EOS PCT: 0.3 %
HCT: 46 % (ref 40.0–52.0)
HGB: 15.7 g/dL (ref 13.0–18.0)
Lymphocyte #: 1.2 10*3/uL (ref 1.0–3.6)
Lymphocyte %: 10.1 %
MCH: 30 pg (ref 26.0–34.0)
MCHC: 34.1 g/dL (ref 32.0–36.0)
MCV: 88 fL (ref 80–100)
MONOS PCT: 10.2 %
Monocyte #: 1.2 x10 3/mm — ABNORMAL HIGH (ref 0.2–1.0)
Neutrophil #: 9.6 10*3/uL — ABNORMAL HIGH (ref 1.4–6.5)
Neutrophil %: 79 %
Platelet: 179 10*3/uL (ref 150–440)
RBC: 5.22 10*6/uL (ref 4.40–5.90)
RDW: 13.8 % (ref 11.5–14.5)
WBC: 12.2 10*3/uL — ABNORMAL HIGH (ref 3.8–10.6)

## 2014-03-13 LAB — LIPASE, BLOOD: Lipase: 98 U/L (ref 73–393)

## 2014-04-21 LAB — ETHANOL: Ethanol: <3 mg/dL

## 2014-04-21 LAB — DRUG SCREEN, URINE
Amphetamines, Ur Screen: NEGATIVE (ref ?–1000)
BENZODIAZEPINE, UR SCRN: NEGATIVE (ref ?–200)
Barbiturates, Ur Screen: NEGATIVE (ref ?–200)
Cannabinoid 50 Ng, Ur ~~LOC~~: POSITIVE (ref ?–50)
Cocaine Metabolite,Ur ~~LOC~~: POSITIVE (ref ?–300)
MDMA (ECSTASY) UR SCREEN: NEGATIVE (ref ?–500)
Methadone, Ur Screen: NEGATIVE (ref ?–300)
Opiate, Ur Screen: NEGATIVE (ref ?–300)
Phencyclidine (PCP) Ur S: NEGATIVE (ref ?–25)
TRICYCLIC, UR SCREEN: NEGATIVE (ref ?–1000)

## 2014-04-21 LAB — URINALYSIS, COMPLETE
BACTERIA: NONE SEEN
Bilirubin,UR: NEGATIVE
Blood: NEGATIVE
Glucose,UR: NEGATIVE mg/dL (ref 0–75)
LEUKOCYTE ESTERASE: NEGATIVE
Nitrite: NEGATIVE
Ph: 5 (ref 4.5–8.0)
Protein: 100
RBC,UR: 2 /HPF (ref 0–5)
Specific Gravity: 1.028 (ref 1.003–1.030)
Squamous Epithelial: 1

## 2014-04-21 LAB — COMPREHENSIVE METABOLIC PANEL
ALK PHOS: 82 U/L
ALT: 44 U/L
ALT: 47 U/L
AST: 31 U/L (ref 15–37)
Albumin: 4.6 g/dL (ref 3.4–5.0)
Albumin: 4.3 g/dL (ref 3.4–5.0)
Alkaline Phosphatase: 85 U/L
Anion Gap: 7 (ref 7–16)
Anion Gap: 7 (ref 7–16)
BILIRUBIN TOTAL: 0.9 mg/dL (ref 0.2–1.0)
BUN: 12 mg/dL (ref 7–18)
BUN: 10 mg/dL (ref 7–18)
Bilirubin,Total: 1.2 mg/dL — ABNORMAL HIGH (ref 0.2–1.0)
CALCIUM: 9.1 mg/dL (ref 8.5–10.1)
CHLORIDE: 106 mmol/L (ref 98–107)
CHLORIDE: 105 mmol/L (ref 98–107)
CO2: 25 mmol/L (ref 21–32)
CO2: 30 mmol/L (ref 21–32)
CREATININE: 1.09 mg/dL (ref 0.60–1.30)
Calcium, Total: 9.5 mg/dL (ref 8.5–10.1)
Creatinine: 1.01 mg/dL (ref 0.60–1.30)
EGFR (African American): >60
EGFR (Non-African Amer.): >60
Glucose: 86 mg/dL (ref 65–99)
Glucose: 68 mg/dL (ref 65–99)
OSMOLALITY: 275 (ref 275–301)
Osmolality: 280 (ref 275–301)
POTASSIUM: 3.8 mmol/L (ref 3.5–5.1)
POTASSIUM: 4.4 mmol/L (ref 3.5–5.1)
SGOT(AST): 28 U/L (ref 15–37)
SODIUM: 138 mmol/L (ref 136–145)
Sodium: 142 mmol/L (ref 136–145)
TOTAL PROTEIN: 8.5 g/dL — AB (ref 6.4–8.2)
Total Protein: 8.7 g/dL — ABNORMAL HIGH (ref 6.4–8.2)

## 2014-04-21 LAB — SALICYLATE LEVEL: SALICYLATES, SERUM: 2.6 mg/dL

## 2014-04-21 LAB — CBC
HCT: 45.5 % (ref 40.0–52.0)
HGB: 15.2 g/dL (ref 13.0–18.0)
MCH: 29.4 pg (ref 26.0–34.0)
MCHC: 33.4 g/dL (ref 32.0–36.0)
MCV: 88 fL (ref 80–100)
PLATELETS: 286 10*3/uL (ref 150–440)
RBC: 5.18 10*6/uL (ref 4.40–5.90)
RDW: 13.2 % (ref 11.5–14.5)
WBC: 8.5 10*3/uL (ref 3.8–10.6)

## 2014-04-21 LAB — ACETAMINOPHEN LEVEL

## 2014-04-22 ENCOUNTER — Inpatient Hospital Stay: Payer: Self-pay | Admitting: Psychiatry

## 2014-06-22 ENCOUNTER — Emergency Department: Payer: Self-pay | Admitting: Emergency Medicine

## 2014-06-22 LAB — COMPREHENSIVE METABOLIC PANEL
ALT: 193 U/L — AB
AST: 76 U/L — AB (ref 15–37)
Albumin: 4.6 g/dL (ref 3.4–5.0)
Alkaline Phosphatase: 96 U/L
Anion Gap: 8 (ref 7–16)
BUN: 15 mg/dL (ref 7–18)
Bilirubin,Total: 0.7 mg/dL (ref 0.2–1.0)
CHLORIDE: 102 mmol/L (ref 98–107)
Calcium, Total: 9.3 mg/dL (ref 8.5–10.1)
Co2: 26 mmol/L (ref 21–32)
Creatinine: 1.15 mg/dL (ref 0.60–1.30)
EGFR (African American): >60
Glucose: 120 mg/dL — ABNORMAL HIGH (ref 65–99)
OSMOLALITY: 274 (ref 275–301)
POTASSIUM: 3.7 mmol/L (ref 3.5–5.1)
Sodium: 136 mmol/L (ref 136–145)
TOTAL PROTEIN: 9.3 g/dL — AB (ref 6.4–8.2)

## 2014-06-22 LAB — URINALYSIS, COMPLETE
Bilirubin,UR: NEGATIVE
Blood: NEGATIVE
GLUCOSE, UR: NEGATIVE mg/dL (ref 0–75)
Ketone: NEGATIVE
Leukocyte Esterase: NEGATIVE
NITRITE: NEGATIVE
PH: 6 (ref 4.5–8.0)
RBC,UR: 1 /HPF (ref 0–5)
Specific Gravity: 1.027 (ref 1.003–1.030)
Squamous Epithelial: <1

## 2014-06-22 LAB — CBC
HCT: 46.8 % (ref 40.0–52.0)
HGB: 15.9 g/dL (ref 13.0–18.0)
MCH: 30 pg (ref 26.0–34.0)
MCHC: 34 g/dL (ref 32.0–36.0)
MCV: 88 fL (ref 80–100)
PLATELETS: 263 10*3/uL (ref 150–440)
RBC: 5.31 10*6/uL (ref 4.40–5.90)
RDW: 13.9 % (ref 11.5–14.5)
WBC: 11 10*3/uL — ABNORMAL HIGH (ref 3.8–10.6)

## 2014-06-22 LAB — ETHANOL: Ethanol: <3 mg/dL

## 2014-06-22 LAB — DRUG SCREEN, URINE
Amphetamines, Ur Screen: NEGATIVE (ref ?–1000)
BENZODIAZEPINE, UR SCRN: NEGATIVE (ref ?–200)
Barbiturates, Ur Screen: NEGATIVE (ref ?–200)
CANNABINOID 50 NG, UR ~~LOC~~: POSITIVE (ref ?–50)
COCAINE METABOLITE, UR ~~LOC~~: POSITIVE (ref ?–300)
MDMA (ECSTASY) UR SCREEN: NEGATIVE (ref ?–500)
Methadone, Ur Screen: NEGATIVE (ref ?–300)
Opiate, Ur Screen: NEGATIVE (ref ?–300)
PHENCYCLIDINE (PCP) UR S: NEGATIVE (ref ?–25)
TRICYCLIC, UR SCREEN: NEGATIVE (ref ?–1000)

## 2014-06-22 LAB — SALICYLATE LEVEL: Salicylates, Serum: 3.6 mg/dL — ABNORMAL HIGH

## 2014-06-22 LAB — ACETAMINOPHEN LEVEL: Acetaminophen: <2 ug/mL

## 2014-07-02 ENCOUNTER — Emergency Department: Payer: Self-pay | Admitting: Emergency Medicine

## 2014-11-10 IMAGING — CR LEFT WRIST - COMPLETE 3+ VIEW
1 series · 4 of 4 positions shown · non-contrast
Comparison: none

REASON FOR EXAM: cut
COMMENTS:   LMP: (Male)

PROCEDURE:     DXR - DXR WRIST LT COMP WITH OBLIQUES  - June 29, 2012 [DATE]
RESULT:     There is no evidence of fracture, dislocation, or malalignment.

[Series 1: pa · 0.17mm/px · 4 of 4 slices shown]
[im 1/4]
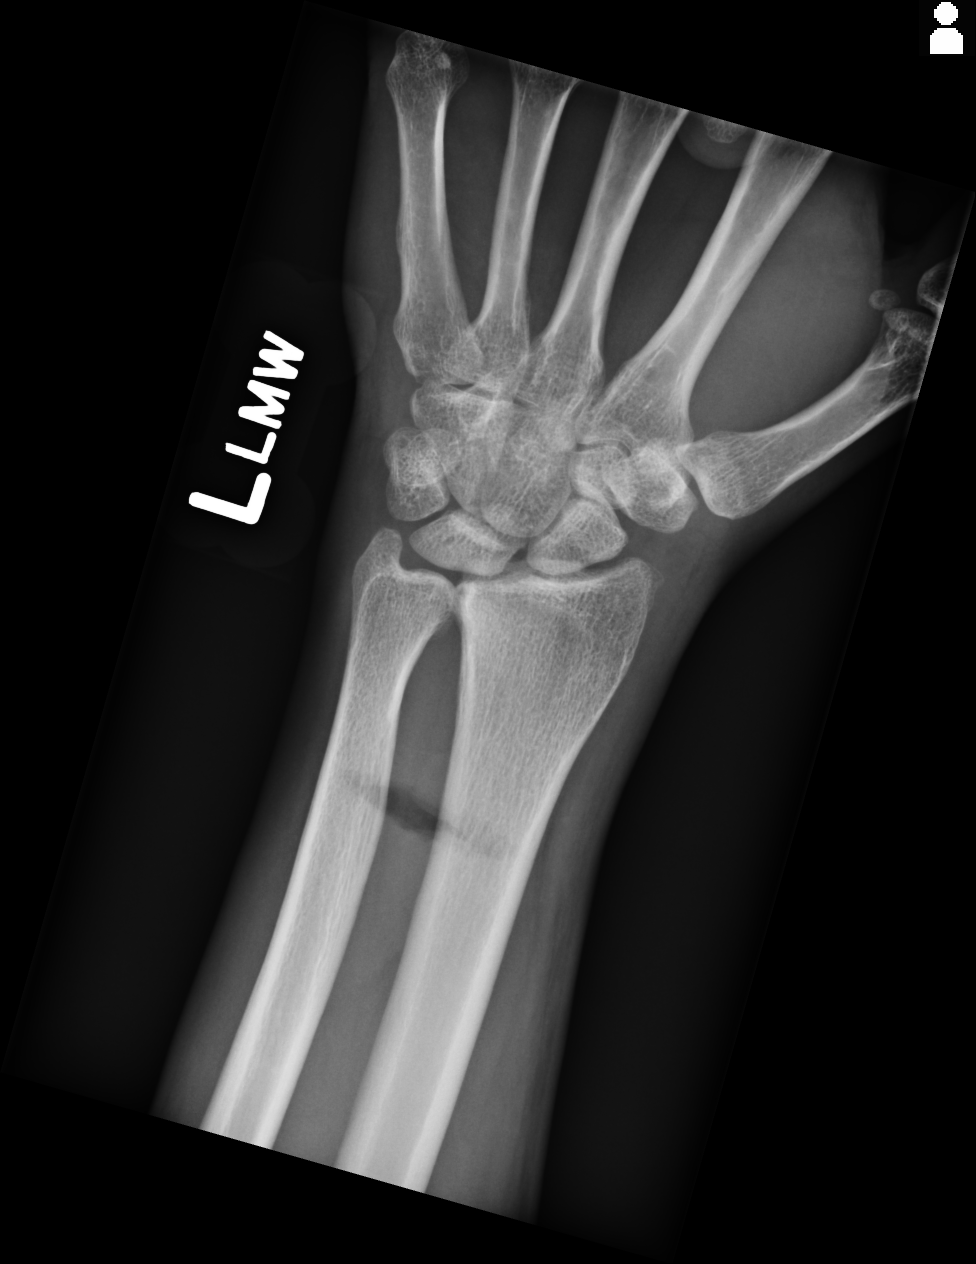
[im 2/4]
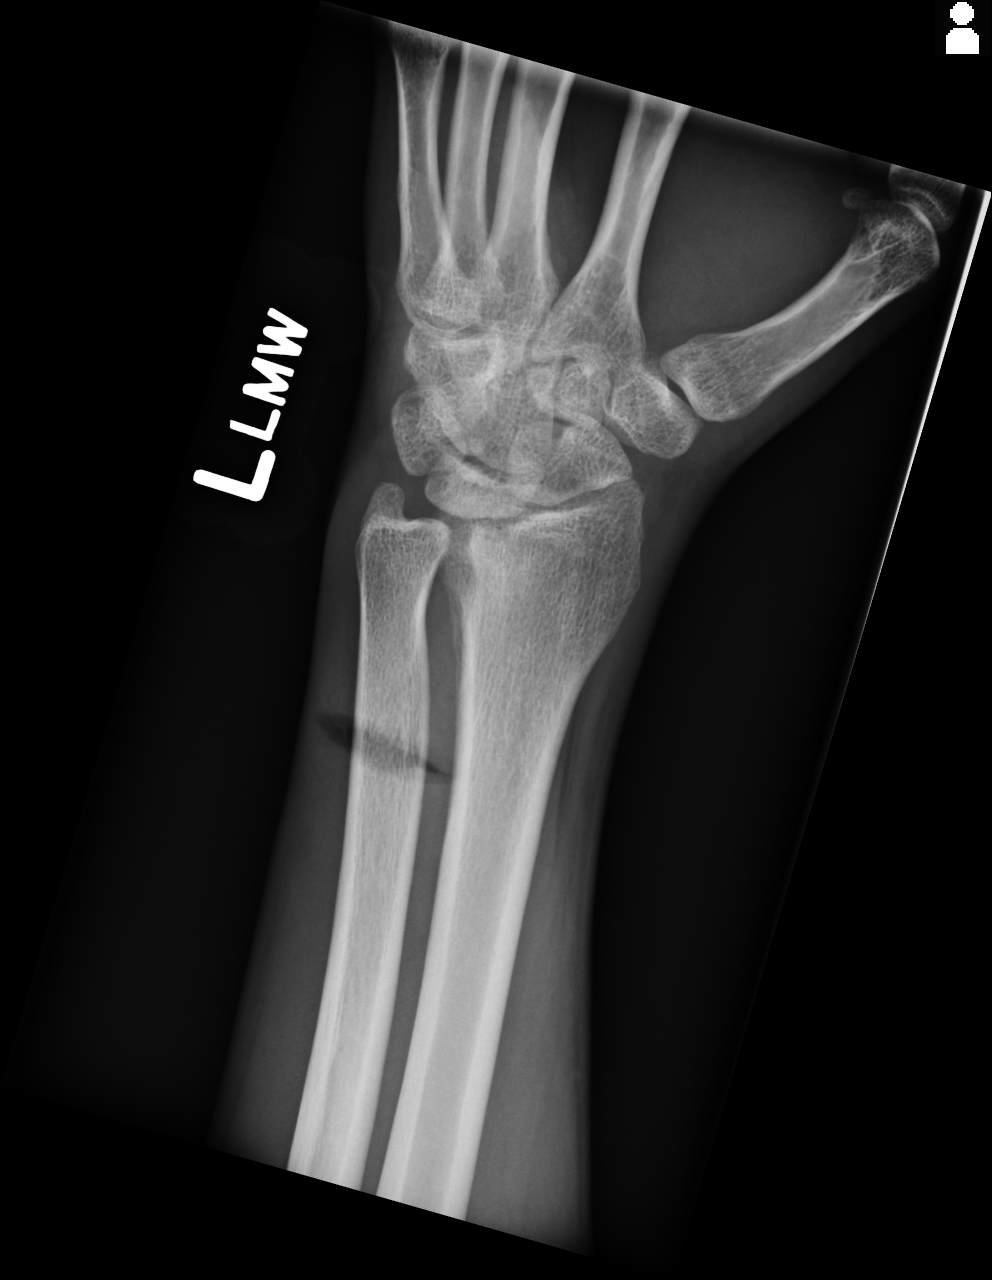
[im 3/4]
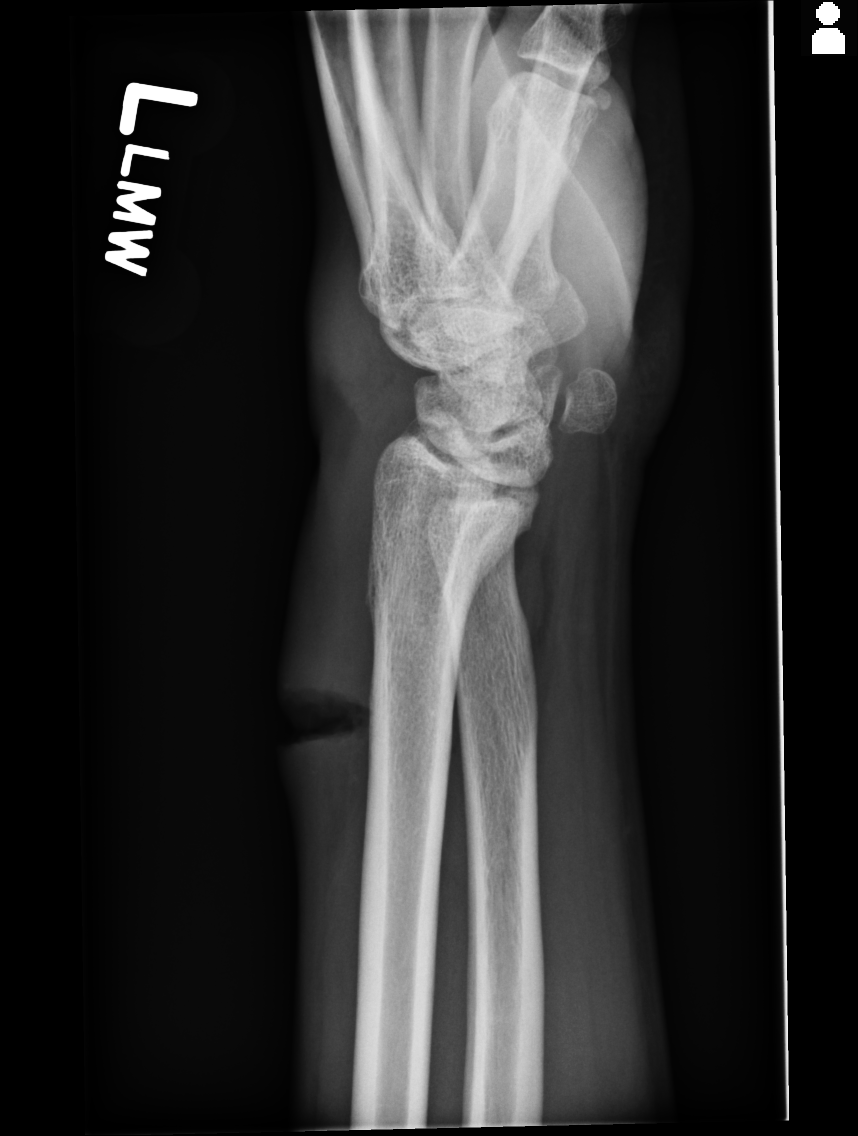
[im 4/4]
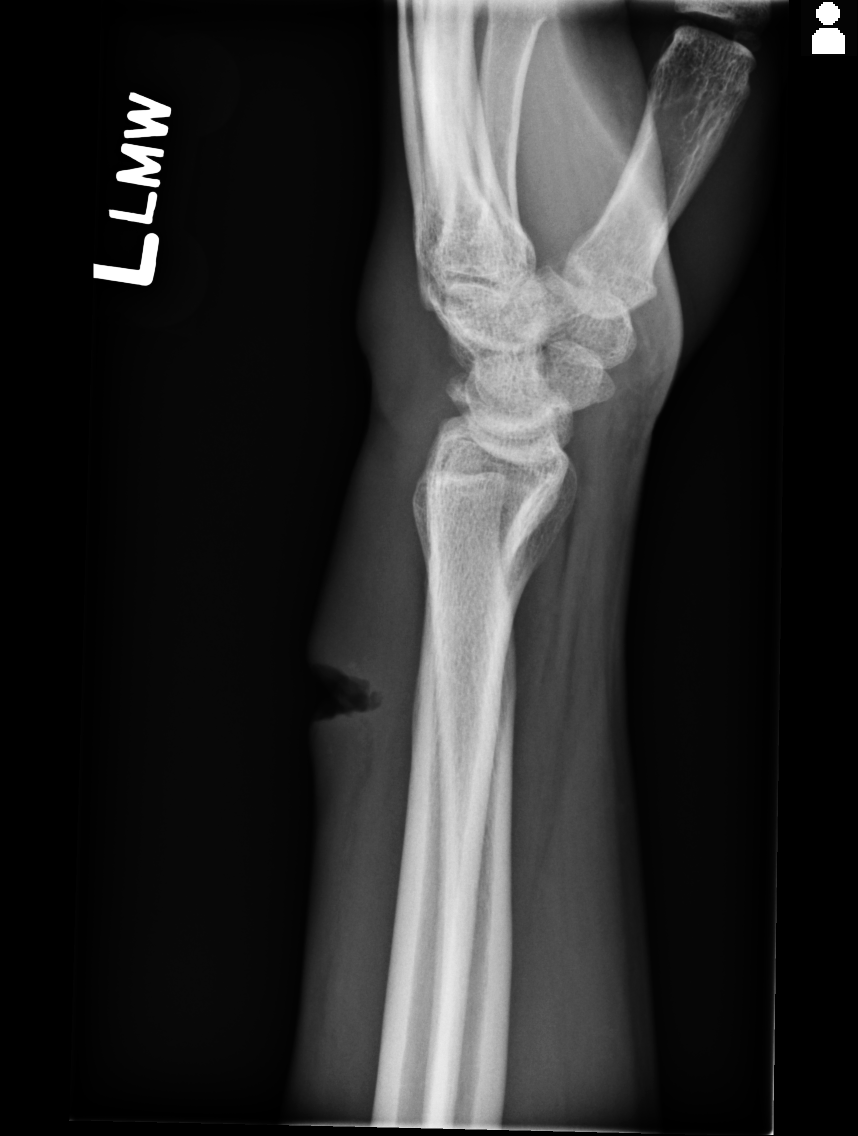

[4 of 4 positions shown; findings below may reference images not displayed]

IMPRESSION: 1. No evidence of acute abnormalities.
2. If there are persistent complaints of pain or persistent clinical
concern, a repeat evaluation in 7-10 days is recommended if clinically
warranted.

## 2014-11-10 NOTE — Op Note (Signed)
PATIENT NAME:  [name], [name] DATE OF BIRTH:  1986-01-26  DATE OF PROCEDURE:  05/25/2013  PREOPERATIVE DIAGNOSIS: Right calf abscess.   POSTOPERATIVE DIAGNOSIS: Right calf abscess.   PROCEDURE PERFORMED: Incision and drainage of right calf abscess with insertion of a 1/4-inch Penrose drain.   ESTIMATED BLOOD LOSS: 5 mL.   COMPLICATIONS: None.   SPECIMENS: None.   ANESTHESIA: MAC local.   INDICATION FOR SURGERY: [name] is a pleasant 29 year old with a history of multiple soft tissue abscesses, who presented with right leg cellulitis and abscess. He was thus brought to the operating room for incision and drainage of abscess.   DETAILS OF PROCEDURE: Informed consent was obtained. [name] was brought to the operating room suite. He was given IV sedation. His right leg was prepped and draped in standard surgical fashion. A timeout was then performed correctly identifying the patient name, operative site and procedure to be performed. Incision was then made over the area of the puncta where the cyst came to a head. There was some purulence initially. This was opened. I probed the cavity, broke all loculations. Laterally, there was extension of abscess probably 3 cm. I then made a counterincision at the end of the cavity and placed a Penrose drain through this tract. I then sutured the tract to the adjacent skin with 3-0 nylon suture. The wound was then packed with 1/4-inch iodoform gauze. An ABD pad and Kerlix was then placed her in the wound. The patient was then awoken and brought to the postanesthesia care unit. There were no immediate complications. Needle, sponge and instrument counts were correct at the end of the procedure.    ____________________________ Si Raiderhristopher A. Amil Bouwman, MD cal:jm D: 05/25/2013 13:06:21 ET T: 05/25/2013 13:47:30 ET JOB#: [id number]  cc: Cristal Deerhristopher A. Davan Hark, MD, <Dictator> Jarvis NewcomerHRISTOPHER A Trinadee Verhagen MD ELECTRONICALLY SIGNED 05/25/2013  15:21

## 2014-11-10 NOTE — Consult Note (Signed)
PATIENT NAME:  [name], [name]  DATE OF CONSULTATION:  05/24/2013  REFERRING PHYSICIAN:   CONSULTING PHYSICIAN:  Cristal Deerhristopher A. Ranetta Armacost, MD  REASON FOR CONSULTATION: Right calf swelling, tenderness, induration and leukocytosis.   HISTORY OF PRESENT ILLNESS: [name] is a pleasant 29 year old male with a history of multiple soft tissue abscesses in the upper and lower extremities requiring incision and drainage, who now presents with 2 days of right lower extremity swelling, tenderness and streaking. He says that this began approximately 2 days ago. It has gotten worse and is excruciatingly painful. He is not amenable to bedside drainage and has allergies to local anesthetics. He also has subjective fevers and chills and says that his other abscesses began this way including the one that had caused problems with his upper extremity, which he said required tendon repair. No obvious injury. No chills, night sweats, shortness of breath, cough, chest pain, abdominal pain, nausea, vomiting, dysuria or hematuria.   PAST MEDICAL HISTORY:  1.  History of multiple soft tissue abscesses and cellulitis, which have  required multiple operations.  2.  History of neurogenic bladder.  3.  History of cocaine and marijuana dependence.  4.  Gastroesophageal reflux disease.  5.  History of cystoscopy.  6.  History of hand surgery.   ALLERGIES: CODEINE, LIDOCAINE, NOVOCAIN, PENICILLIN AND BEE STINGS.   PSYCHOSOCIAL HISTORY: Smokes approximately 1/3 to 1 pack a day per his report. Lives with wife. Social alcohol. History of marijuana and cocaine abuse.   FAMILY HISTORY: Dad with subjective congestive heart failure and diabetes mellitus.   REVIEW OF SYSTEMS: A 12-point review of systems was obtained. Pertinent positives and negatives as above.   PHYSICAL EXAMINATION:  VITAL SIGNS: Temperature 98.3, pulse 85, blood pressure 122/75, respirations 20, 95% on room air.   GENERAL: No acute distress, alert and oriented x 3.  HEENT: Head: Normocephalic, atraumatic. Eyes: No scleral icterus. No conjunctivitis.  ABDOMEN: Soft, nontender, nondistended.  EXTREMITIES: Moves all extremities well. Has an approximately 4 x 4 cm area of induration on the right calf with a punctum. No obvious purulence. Exquisitely tender. No other obvious lesions elsewhere.  NEUROLOGIC: Cranial nerves II through XII grossly intact.   LABS: White cell count of 16.9. AST and ALT are elevated. Urinalysis is negative.   ASSESSMENT AND PLAN: [name] is a pleasant 29 year old with history of severe soft tissue infections, likely Methicillin-resistant Staphylococcus aureus. Potentially could be drained at bedside; however, the patient is not amenable to this. We will plan on operative drainage. I have spoken with [name] regarding this and he agrees with this plan.  ____________________________ Si Raiderhristopher A. Siera Beyersdorf, MD cal:aw D: 05/24/2013 11:18:54 ET T: 05/24/2013 11:30:09 ET JOB#: [id number]  cc: Cristal Deerhristopher A. Gianny Killman, MD, <Dictator> Jarvis NewcomerHRISTOPHER A Arnetra Terris MD ELECTRONICALLY SIGNED 05/25/2013 15:20

## 2014-11-10 NOTE — Discharge Summary (Signed)
PATIENT NAME:  [name], [name] MR#:  [id number] DATE OF BIRTH:  02/29/1986  DATE OF ADMISSION:  05/24/2013 DATE OF DISCHARGE:  05/26/2013  ADMISSION DIAGNOSES: 1.  Right leg cellulitis/abscess MSSA 2.  History of methicillin-resistant Staphylococcus aureus.  3.  Gastroesophageal reflux disease.  4.  Neurogenic bladder.   CONSULTATIONS:  Dr. Juliann PulseLundquist.   PERTINENT LABORATORY AT DISCHARGE: Wound culture is so far growing out MSSA  White blood cells 8.2, hemoglobin 15, hematocrit 43, platelets are 188. Sodium 136, potassium 4.2, chloride 106, bicarb 30, BUN 15, creatinine 0.92.   Blood cultures negative to date.   HOSPITAL COURSE: A 29 year old male with known history of MRSA who presented with a right leg abscess. For further details, please refer to the H and P.   1.  Leg cellulitis/abscess. The patient has a history of methicillin-resistant Staphylococcus aureus. He was placed on vancomycin and surgery was consulted. He is status post an incision and drainage.  Wound cultures so far are growing out MSSA  The patient will be discharged with Bactrim and clindamycin. Blood cultures are negative to date. The patient will follow up next week with Dr. Juliann PulseLundquist. He has a drain in place right now which will be taken out in a week. The patient was given instructions to take the packing out tomorrow. 2.  Gastroesophageal reflux disease.  3.  History of neurogenic bladder, status post intake and output p.r.n.   DISCHARGE MEDICATIONS: 1.  Oxycodone 5 mg 1 to 2 tablets q. 4 to 6 hours p.r.n. pain, #20.  2.  Clindamycin 300 mg t.i.d. x 10 days.  3.  Bactrim double strength 1 tablet b.i.d.  DISCHARGE DRESSING:  Remove packing tomorrow. Leave drain in until seen by Dr. Juliann PulseLundquist.   DISCHARGE DIET:  Regular diet.   DISCHARGE ACTIVITY:  As tolerated.  DISCHARGE FOLLOW-UP: The patient will follow up in 1 week with Dr. Juliann PulseLundquist.  The patient is medically stable for discharge.   TIME SPENT:  35  minutes   ____________________________ Kida Digiulio P. Juliene PinaMody, MD spm:ce D: 05/26/2013 13:58:33 ET T: 05/26/2013 14:30:53 ET JOB#: [id number]  cc: Sander Speckman P. Juliene PinaMody, MD, <Dictator> Dr. Wallie CharLundquist   Harriette Tovey P Riven Beebe MD ELECTRONICALLY SIGNED 05/27/2013 15:31

## 2014-11-10 NOTE — H&P (Signed)
PATIENT NAME:  [name], [name] DATE OF BIRTH:  03-24-1986  DATE OF ADMISSION:  05/24/2013  PRIMARY CARE PHYSICIAN: None.  REFERRING PHYSICIAN: Doralee AlbinoLinda M. Ladona Ridgelaylor, MD  CHIEF COMPLAINT: Swelling with redness on the right calf.   HISTORY OF PRESENT ILLNESS: The patient is a 29 year old Caucasian male with past medical history of muscle cellulitis on the left upper extremity with several surgeries,  GERD, marijuana and cocaine abuse, who is presenting to the ER with a chief complaint of redness associated with pain on the right side of the calf muscle. He first noticed a small red bump on the right calf 2 days ago, and within 2 days, it has gotten worse, swollen, became painful, and it is streaking up to the right thigh. Denies any papules or pustules. Denies any bug bites. No fever, but complaining of nausea. The patient is also reporting that he wants more pain medicine as he is in pain.   PAST MEDICAL HISTORY:  1. Muscle cellulitis of the left upper extremity, which was treated with several courses of antibiotics and needed multiple surgeries.  2. Neurogenic bladder. 3. Cocaine dependence. 4. Marijuana dependence.  5. GERD.   PAST SURGICAL HISTORY:  1. Cystoscopy.  2. Left hand surgery   ALLERGIES: THE PATIENT IS ALLERGIC TO CODEINE, LIDOCAINE, NOVOCAINE, PENICILLIN, BEE STINGS.   PSYCHOSOCIAL HISTORY: Lives at home with wife. Smokes 1/2 to 1 pack a day. Occasional intake of alcohol. Admits to marijuana and cocaine.   FAMILY HISTORY: Dad had congestive heart failure, diabetes mellitus.  REVIEW OF SYSTEMS: CONSTITUTIONAL: Denies any fever, fatigue.  EYES: Denies blurry vision, double vision.  EARS, NOSE, THROAT: No epistaxis, discharge. RESPIRATORY: Denies cough, COPD.  CARDIOVASCULAR: No chest pain or palpitations.  GASTROINTESTINAL: Has nausea. No vomiting, diarrhea. No abdominal pain.  GENITOURINARY: No dysuria, hematuria. Denies hernia.  ENDOCRINE: Denies polyuria,  nocturia or thyroid problems.  HEMATOLOGIC AND LYMPHATIC: No anemia, easy bruising or bleeding.  INTEGUMENTARY: No acne, rash, lesions.  MUSCULOSKELETAL: No joint pain in the neck and back.  PSYCHIATRIC: No ADD or OCD.   PHYSICAL EXAMINATION:  VITAL SIGNS: Temperature 98 degrees Fahrenheit, pulse 86, respirations 20, blood pressure 145/60, pulse oximetry 96%.  GENERAL APPEARANCE: Not under acute distress. Moderately built and nourished.  HEENT: Normocephalic, atraumatic. Pupils are equally reactive to light and accommodation. No scleral icterus. No conjunctival injection. No sinus tenderness. No postnasal drip. Moist mucous membranes.  NECK: Supple. No JVD. No thyromegaly.  LUNGS: Clear to auscultation bilaterally. No accessory muscle usage. No anterior chest wall tenderness on palpation.  CARDIAC: S1, S2 normal. Regular rate and rhythm. No murmurs.  GASTROINTESTINAL: Soft. Bowel sounds are positive in all 4 quadrants. Nontender, nondistended. No hepatosplenomegaly.  NEUROLOGIC: Awake, alert and oriented x3. Motor and sensory grossly intact. Reflexes are 2+.  EXTREMITIES: No edema. No cyanosis. No clubbing.  SKIN: Multiple tattoos are present on upper and lower extremities. The right calf area is erythematous, tender to touch, edematous. Streaks of redness are tracking to the thigh area. Skin with normal turgor.  PSYCHIATRIC: Normal mood and affect.   LABORATORY AND IMAGING STUDIES: LFT: AST is elevated at 74, ALT 115. WBC 16.9, hemoglobin 14.9, hematocrit 42.7, platelets are 90. Urinalysis: Yellow in color, hazy in appearance, glucose, bilirubin, ketones are negative, specific gravity 1.023, nitrites and leukocyte esterase are negative. BMP is normal except sodium which is at 132.   ASSESSMENT AND PLAN: A 29 year old Caucasian male presenting to the ER with a chief complaint  of redness, swelling and pain in the right calf area. Will be admitted with the following assessment and plan.   1.  Right calf cellulitis, probably methicillin-resistant Staphylococcus aureus. Will admit him to Med/Surg floor. Wound cultures are ordered. Will provide him IV vancomycin and monitor renal function closely.  2. Neurogenic bladder.  3. Cocaine and marijuana dependence. He was counseled to stop taking illicit drugs.  4. History of nicotine dependence. The patient will be on nicotine patch.   5. Gastroesophageal reflux disease. The patient will be on gastrointestinal prophylaxis.  6. Deep vein thrombosis prophylaxis with Lovenox subcutaneous.   Diagnosis and plan of care were discussed in detail with the patient. He is aware of the plan.   CODE STATUS: He is full code.   TOTAL TIME SPENT ON ADMISSION: 45 minutes.   The patient verbalized understanding of the diagnosis and plan of care.   ____________________________ Ramonita Lab, MD ag:lb D: 05/24/2013 07:23:00 ET T: 05/24/2013 07:36:32 ET JOB#: [id number]  cc: Ramonita Lab, MD, <Dictator> Ramonita Lab MD ELECTRONICALLY SIGNED 06/05/2013 8:20
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
## 2014-11-11 NOTE — Consult Note (Signed)
Brief Consult Note: Diagnosis: major depression.   Patient was seen by consultant.   Consult note dictated.   Orders entered.   Discussed with Attending MD.   Comments: Psychiatry: Patient returns with recent increase in anger but no suicidal or homicidal ideation. Wants to restart meds. Restart tegretol, hydroxyzine and trazodone and can be released home with [facility] follow up.  Electronic Signatures: Dineen Conradt, Jackquline DenmarkJohn T (MD)  (Signed 04-Dec-15 12:42)  Authored: Brief Consult Note   Last Updated: 04-Dec-15 12:42 by Audery Amellapacs, Kysean Sweet T (MD)

## 2014-11-11 NOTE — Consult Note (Signed)
Brief Consult Note: Diagnosis: Major depression.   Patient was seen by consultant.   Consult note dictated.   Recommend further assessment or treatment.   Orders entered.   Discussed with Attending MD.   Comments: Severe depression in the context of recent cocaine abuse. Admit to inpatient psychiatry on precautions. See full note.  Electronic Signatures: Audery Amellapacs, Kingstin Heims T (MD)  (Signed 03-Oct-15 01:05)  Authored: Brief Consult Note   Last Updated: 03-Oct-15 01:05 by Audery Amellapacs, Trentyn Boisclair T (MD)

## 2014-11-11 NOTE — H&P (Signed)
PATIENT NAME:  [name], [name] MR#:  [id number] DATE OF BIRTH:  04/30/1986  DATE OF ADMISSION:  04/22/2014  REASON FOR ADMISSION: This is a 29 year old man with a history of substance abuse who brought himself to the Emergency Room on 04/22/2014.  CHIEF COMPLAINT: "I lost everything and I want to kill some people and kill myself."  HISTORY OF PRESENT ILLNESS: Patient states that he has been out of control in his mood and behavior for the past 2 weeks. He has had multiple stressors. He states that he lost his home, his business, his car, his friends, and his wife all within a short period of time of 2 weeks. He lost all of the financial things supposedly because his wife was not paying bills correctly. He says he has been homeless for the last week. He is using crack cocaine and snorting cocaine. Denies that he has been abusing other drugs, although he says, also, that he has been snorting dried residue from the radiator coolant. He also admits to marijuana but does not consider this a drug. Apparently he thought this might kill him. He has been having suicidal thoughts. Does not state a specific manner to kill himself, though he did mention using cocaine to kill himself. He states that he has been having homicidal ideation, and in the initial evaluation he said it was directed towards people who made him lose his property, including bankers, but not toward his wife. He is also having some auditory hallucinations on intake on October 3, which he denies today.  PAST PSYCHIATRIC HISTORY: History of depression and substance. He was here last in 2013. He has not been on any psychiatric medications. He says that he stayed stable while he was off drugs for the most part since he was here last.  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 He had financial collapse, causing him to lose everything. He believes that the police and the sheriff have been out to get him  because they know about his financial problems. He says that right now he does not have anyone he can trust or turn to.  FAMILY HISTORY: Unknown family history. Patient unable to provide any more details.  MEDICAL HISTORY: No known medical problems.  MEDICATION: No current medications.  ALLERGIES: CODEINE, LIDOCAINE, NOVOCAIN, PENICILLIN.   REVIEW OF SYSTEMS: Depressed mood, suicidal ideation, homicidal ideation, possible auditory hallucinations. Poor sleep, poor appetite, hopelessness. Rest of the physical review of systems is negative.   MENTAL STATUS EXAMINATION: Neatly groomed, decent hygiene, looks his stated age, and cooperative throughout most of the exam. Good eye contact. Normal psychomotor activity. Speech normal rate, tone, volume. Affect is dysphoric and irritable. Cheerful at times; also anxious. Mood stated as depressed. Thoughts are lucid, with no loosening of associations. Denies visual hallucinations. Expresses vague auditory hallucinations intermittently; however, denies those today. Positive for suicidal ideation, homicidal ideation without a specific target that he will name. Alert and oriented x 4. Judgment nad insight adequate. Can remember 3 out of 3 objects immediately and at 3 minutes.  LABORATORY RESULTS: Chemistry panel unremarkable. Salicylates and acetaminophen negative. Chemistry panel: Elevated bilirubin at 1.2; everything else normal. CBC normal. Urinalysis: Protein 100, 14 white blood cells. Drug screen positive for cannabis and cocaine.  PHYSICAL EXAMINATION: VITAL SIGNS: Will list vitals in chart.  ASSESSMENT: A 29 year old man who has reported suicidal and homicidal ideation, depression, anger, mood instability, sleeplessness, all in context of cocaine abuse. Requires hospitalization because  of potential dangerousness.  TREATMENT PLAN: Admit to psychiatry. Suicide and close observation precautions in place. Medications p.r.n. for agitation and sleep. Will  review labs.  DIAGNOSES, PRIMARY AND PRINCIPAL: AXIS I: Major depression, severe, recurrent.  AXIS II: Deferred.  SECONDARY DIAGNOSIS:  AXIS I: Cocaine abuse.    ____________________________ Greer Wainright L. Alvester MorinBell, MD tlb:ST D: 04/22/2014 23:39:25 ET T: 04/23/2014 00:49:58 ET JOB#: [id number]  cc: Nabeeha Badertscher L. Alvester MorinBell, MD, <Dictator> Kiam Bransfield Rhae LernerL Deundra Bard MD ELECTRONICALLY SIGNED 05/27/2014 22:54
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
## 2014-11-12 NOTE — Discharge Summary (Signed)
PATIENT NAME:  [name], [name] MR#:  [id number] DATE OF BIRTH:  [birth year]  DATE OF ADMISSION:  12/12/2011 DATE OF DISCHARGE:  12/15/2011  HOSPITAL COURSE: [name] was admitted to the hospital because of an overdose on trazodone. Since coming into the hospital, he has denied any suicidal ideation. He has not shown any dangerous or suicidal behavior. He has spent a lot of time telling various and contradictory stories and information to different people in an attempt to get out of the hospital mostly. After he understood that he was not going to be discharged over the weekend, he has calmed down and been cooperative and mostly appropriate. No dangerous behavior noted. Affect is upbeat and smiling. He denies any symptoms of depression. He eventually did at least superficially show some improved insight into his cocaine use and how it was affecting his mood and judgment. He agreed that he ought to stop using cocaine, and he is at least willing to consider stopping use of marijuana. He was not started on any psychiatric medicine other than p.r.n. Vistaril which he has found somewhat helpful. At this point, he is not acutely dangerous and appears to be back at his baseline. He is not likely to further benefit from inpatient hospitalization. Psychoeducation and supportive therapy are done. He is to be referred to the outpatient [facility] in the community for substance abuse treatment.   DISPOSITION: See above. He will go back to stay with his father.   LABORATORY, DIAGNOSTIC AND RADIOLOGICAL DATA:  Admission labs showed chemistry panel unremarkable except for an ALT of 276, AST of 109. Alcohol undetectable. TSH normal.  Urinary drug screen positive for MDMA, cocaine and cannabis.  CBC normal.  Urinalysis normal.  Salicylates and acetaminophen normal.  CT scan of the head was done because he reported that he had been kicked by a horse. There was no bony abnormality found.   DISCHARGE MEDICATIONS:  Vistaril 50 mg every 6 hours p.r.n. anxiety.   DISCHARGE MENTAL STATUS EXAMINATION: Neatly dressed and groomed. Appropriate interaction. Good eye contact. Normal psychomotor activity. Speech normal rate, tone, and volume. Affect upbeat, happy, joking. Mood stated as being good. No sign of loosening of association, flight of ideas or disorganized thinking. No sign of delusions. Denies suicidal or homicidal ideation. Denies hallucinations. Insight and judgment about his substance abuse remains questionable at best.   DIAGNOSIS, PRINCIPAL AND PRIMARY:  AXIS I: Adjustment disorder with acute change in behavior.   SECONDARY DIAGNOSES:  AXIS I:  1. Cocaine dependence.  2. Marijuana dependence.   AXIS II: Antisocial personality traits certainly noticed, if not perhaps full antisocial personality disorder.   AXIS III: History of neurogenic bladder, but for whatever reason he did not seem to require any catheterization while he was in the hospital this time.   AXIS IV: Moderate-to-severe stress from being estranged from his girlfriend.   AXIS V: Functioning at the time of discharge is 55.   ____________________________ Audery AmelJohn T. Clapacs, MD jtc:cbb D: 12/15/2011 11:45:41 ET T: 12/16/2011 10:04:35 ET JOB#: [id number]  cc: Audery AmelJohn T. Clapacs, MD, <Dictator> [name] ELECTRONICALLY SIGNED 12/16/2011 17:56

## 2014-12-22 ENCOUNTER — Emergency Department (HOSPITAL_COMMUNITY): Payer: Self-pay

## 2014-12-22 ENCOUNTER — Inpatient Hospital Stay (HOSPITAL_COMMUNITY)
Admission: EM | Admit: 2014-12-22 | Discharge: 2014-12-24 | DRG: 605 | Disposition: A | Discharge status: Home / Self Care | Payer: Self-pay | Attending: Surgery | Admitting: Surgery

## 2014-12-22 ENCOUNTER — Encounter (HOSPITAL_COMMUNITY): Payer: Self-pay

## 2014-12-22 DIAGNOSIS — W19XXXA Unspecified fall, initial encounter: Secondary | ICD-10-CM | POA: Diagnosis present

## 2014-12-22 DIAGNOSIS — W109XXA Fall (on) (from) unspecified stairs and steps, initial encounter: Secondary | ICD-10-CM | POA: Diagnosis present

## 2014-12-22 DIAGNOSIS — T1490XA Injury, unspecified, initial encounter: Secondary | ICD-10-CM

## 2014-12-22 DIAGNOSIS — S31110A Laceration without foreign body of abdominal wall, right upper quadrant without penetration into peritoneal cavity, initial encounter: Principal | ICD-10-CM | POA: Diagnosis present

## 2014-12-22 HISTORY — DX: Other psychoactive substance abuse, uncomplicated: F19.10

## 2014-12-22 LAB — I-STAT CHEM 8, ED
BUN: 15 mg/dL (ref 6–20)
CALCIUM ION: 1.11 mmol/L — AB (ref 1.12–1.23)
CREATININE: 0.9 mg/dL (ref 0.61–1.24)
Chloride: 105 mmol/L (ref 101–111)
GLUCOSE: 100 mg/dL — AB (ref 65–99)
HEMATOCRIT: 52 % (ref 39.0–52.0)
HEMOGLOBIN: 17.7 g/dL — AB (ref 13.0–17.0)
Potassium: 3.6 mmol/L (ref 3.5–5.1)
SODIUM: 139 mmol/L (ref 135–145)
TCO2: 16 mmol/L (ref 0–100)

## 2014-12-22 LAB — COMPREHENSIVE METABOLIC PANEL
ALT: 128 U/L — ABNORMAL HIGH (ref 17–63)
AST: 75 U/L — ABNORMAL HIGH (ref 15–41)
Albumin: 5 g/dL (ref 3.5–5.0)
Alkaline Phosphatase: 77 U/L (ref 38–126)
Anion gap: 15 (ref 5–15)
BILIRUBIN TOTAL: 1.2 mg/dL (ref 0.3–1.2)
BUN: 11 mg/dL (ref 6–20)
CO2: 17 mmol/L — ABNORMAL LOW (ref 22–32)
Calcium: 10 mg/dL (ref 8.9–10.3)
Chloride: 104 mmol/L (ref 101–111)
Creatinine, Ser: 1.06 mg/dL (ref 0.61–1.24)
GFR calc Af Amer: >60 mL/min (ref >60)
GLUCOSE: 99 mg/dL (ref 65–99)
Potassium: 3.6 mmol/L (ref 3.5–5.1)
Sodium: 136 mmol/L (ref 135–145)
TOTAL PROTEIN: 8.8 g/dL — AB (ref 6.5–8.1)

## 2014-12-22 LAB — PREPARE FRESH FROZEN PLASMA
Unit division: 0
Unit division: 0

## 2014-12-22 LAB — TYPE AND SCREEN
ABO/RH(D): B POS
Antibody Screen: NEGATIVE
Unit division: 0
Unit division: 0

## 2014-12-22 LAB — RAPID URINE DRUG SCREEN, HOSP PERFORMED
Amphetamines: NOT DETECTED
Barbiturates: NOT DETECTED
Benzodiazepines: POSITIVE — AB
Cocaine: POSITIVE — AB
OPIATES: NOT DETECTED
Tetrahydrocannabinol: POSITIVE — AB

## 2014-12-22 LAB — I-STAT ARTERIAL BLOOD GAS, ED
Acid-base deficit: 2 mmol/L (ref 0.0–2.0)
Bicarbonate: 23.1 mEq/L (ref 20.0–24.0)
O2 Saturation: 100 %
PCO2 ART: 38 mmHg (ref 35.0–45.0)
TCO2: 24 mmol/L (ref 0–100)
pH, Arterial: 7.392 (ref 7.350–7.450)
pO2, Arterial: 174 mmHg — ABNORMAL HIGH (ref 80.0–100.0)

## 2014-12-22 LAB — CBC WITH DIFFERENTIAL/PLATELET
BASOS PCT: 0 % (ref 0–1)
Basophils Absolute: 0 10*3/uL (ref 0.0–0.1)
EOS ABS: 0 10*3/uL (ref 0.0–0.7)
EOS PCT: 0 % (ref 0–5)
HEMATOCRIT: 46.9 % (ref 39.0–52.0)
Hemoglobin: 16.9 g/dL (ref 13.0–17.0)
Lymphocytes Relative: 19 % (ref 12–46)
Lymphs Abs: 3.1 10*3/uL (ref 0.7–4.0)
MCH: 30.4 pg (ref 26.0–34.0)
MCHC: 36 g/dL (ref 30.0–36.0)
MCV: 84.4 fL (ref 78.0–100.0)
MONOS PCT: 9 % (ref 3–12)
Monocytes Absolute: 1.4 10*3/uL — ABNORMAL HIGH (ref 0.1–1.0)
NEUTROS PCT: 72 % (ref 43–77)
Neutro Abs: 11.5 10*3/uL — ABNORMAL HIGH (ref 1.7–7.7)
Platelets: 271 10*3/uL (ref 150–400)
RBC: 5.56 MIL/uL (ref 4.22–5.81)
RDW: 13.5 % (ref 11.5–15.5)
WBC: 16.1 10*3/uL — ABNORMAL HIGH (ref 4.0–10.5)

## 2014-12-22 LAB — ABO/RH: ABO/RH(D): B POS

## 2014-12-22 LAB — PROTIME-INR
INR: 1.01 (ref 0.00–1.49)
Prothrombin Time: 13.5 seconds (ref 11.6–15.2)

## 2014-12-22 LAB — I-STAT CG4 LACTIC ACID, ED: Lactic Acid, Venous: 3.47 mmol/L (ref 0.5–2.0)

## 2014-12-22 LAB — LIPASE, BLOOD: Lipase: 17 U/L — ABNORMAL LOW (ref 22–51)

## 2014-12-22 MED ORDER — SODIUM CHLORIDE 0.9 % IV SOLN
2.0000 mg/h | INTRAVENOUS | Status: DC
  Administered 2014-12-22: 0.5 mg/h via INTRAVENOUS
  Filled 2014-12-22: qty 10

## 2014-12-22 MED ORDER — MIDAZOLAM HCL 2 MG/2ML IJ SOLN
INTRAMUSCULAR | Status: AC
  Filled 2014-12-22: qty 4

## 2014-12-22 MED ORDER — ENOXAPARIN SODIUM 40 MG/0.4ML ~~LOC~~ SOLN
40.0000 mg | Freq: Every day | SUBCUTANEOUS | Status: DC
Start: 2014-12-22 — End: 2014-12-24
  Administered 2014-12-22 – 2014-12-23 (×2): 40 mg via SUBCUTANEOUS
  Filled 2014-12-22 (×3): qty 0.4

## 2014-12-22 MED ORDER — FENTANYL CITRATE (PF) 100 MCG/2ML IJ SOLN: 100.0000 ug | Freq: Once | INTRAMUSCULAR | Status: DC

## 2014-12-22 MED ORDER — MORPHINE SULFATE 4 MG/ML IJ SOLN: 4.0000 mg | Freq: Once | INTRAMUSCULAR | Status: DC

## 2014-12-22 MED ORDER — SUCCINYLCHOLINE CHLORIDE 20 MG/ML IJ SOLN
INTRAMUSCULAR | Status: AC
  Filled 2014-12-22: qty 1

## 2014-12-22 MED ORDER — PROPOFOL 1000 MG/100ML IV EMUL
0.0000 ug/kg/min | INTRAVENOUS | Status: DC
  Administered 2014-12-22 – 2014-12-23 (×2): 50 ug/kg/min via INTRAVENOUS
  Administered 2014-12-23: 40 ug/kg/min via INTRAVENOUS
  Filled 2014-12-22 (×2): qty 100

## 2014-12-22 MED ORDER — CETYLPYRIDINIUM CHLORIDE 0.05 % MT LIQD
7.0000 mL | Freq: Four times a day (QID) | OROMUCOSAL | Status: DC
  Administered 2014-12-23 (×4): 7 mL via OROMUCOSAL

## 2014-12-22 MED ORDER — VECURONIUM BROMIDE 10 MG IV SOLR
INTRAVENOUS | Status: AC
Start: 2014-12-22 — End: 2014-12-23
  Filled 2014-12-22: qty 10

## 2014-12-22 MED ORDER — VECURONIUM BROMIDE 10 MG IV SOLR: 10.0000 mg | Freq: Once | INTRAVENOUS | Status: DC

## 2014-12-22 MED ORDER — PROPOFOL 1000 MG/100ML IV EMUL
5.0000 ug/kg/min | Freq: Once | INTRAVENOUS | Status: DC
  Filled 2014-12-22: qty 100

## 2014-12-22 MED ORDER — STERILE WATER FOR INJECTION IJ SOLN
INTRAMUSCULAR | Status: AC
Start: 2014-12-22 — End: 2014-12-23
  Filled 2014-12-22: qty 10

## 2014-12-22 MED ORDER — POTASSIUM CHLORIDE IN NACL 20-0.9 MEQ/L-% IV SOLN
INTRAVENOUS | Status: DC
  Administered 2014-12-22: 23:00:00 via INTRAVENOUS
  Filled 2014-12-22 (×6): qty 1000

## 2014-12-22 MED ORDER — SODIUM CHLORIDE 0.9 % IV SOLN
25.0000 ug/h | INTRAVENOUS | Status: DC
  Administered 2014-12-22: 100 ug/h via INTRAVENOUS
  Filled 2014-12-22: qty 50

## 2014-12-22 MED ORDER — FENTANYL CITRATE (PF) 100 MCG/2ML IJ SOLN
INTRAMUSCULAR | Status: AC
  Filled 2014-12-22: qty 2

## 2014-12-22 MED ORDER — SODIUM CHLORIDE 0.9 % IV SOLN
20.0000 ug/h | INTRAVENOUS | Status: DC
  Administered 2014-12-22: 25 ug/h via INTRAVENOUS
  Filled 2014-12-22: qty 50

## 2014-12-22 MED ORDER — HYDROMORPHONE HCL 1 MG/ML IJ SOLN
INTRAMUSCULAR | Status: AC
  Filled 2014-12-22: qty 1

## 2014-12-22 MED ORDER — STERILE WATER FOR INJECTION IJ SOLN
INTRAMUSCULAR | Status: AC
  Filled 2014-12-22: qty 10

## 2014-12-22 MED ORDER — IOHEXOL 300 MG/ML  SOLN
100.0000 mL | Freq: Once | INTRAMUSCULAR | Status: AC | PRN
  Administered 2014-12-22: 100 mL via INTRAVENOUS

## 2014-12-22 MED ORDER — MIDAZOLAM BOLUS VIA INFUSION
2.0000 mg | INTRAVENOUS | Status: DC | PRN
  Filled 2014-12-22: qty 2

## 2014-12-22 MED ORDER — ROCURONIUM BROMIDE 50 MG/5ML IV SOLN
INTRAVENOUS | Status: AC
  Filled 2014-12-22: qty 2

## 2014-12-22 MED ORDER — FENTANYL BOLUS VIA INFUSION
50.0000 ug | INTRAVENOUS | Status: DC | PRN
  Filled 2014-12-22: qty 50

## 2014-12-22 MED ORDER — MIDAZOLAM HCL 2 MG/2ML IJ SOLN
INTRAMUSCULAR | Status: AC
  Filled 2014-12-22: qty 2

## 2014-12-22 MED ORDER — LORAZEPAM 2 MG/ML IJ SOLN
INTRAMUSCULAR | Status: AC
  Filled 2014-12-22: qty 1

## 2014-12-22 MED ORDER — ETOMIDATE 2 MG/ML IV SOLN
INTRAVENOUS | Status: AC
  Filled 2014-12-22: qty 20

## 2014-12-22 MED ORDER — ONDANSETRON HCL 4 MG PO TABS: 4.0000 mg | ORAL_TABLET | Freq: Four times a day (QID) | ORAL | Status: DC | PRN

## 2014-12-22 MED ORDER — VECURONIUM BROMIDE 10 MG IV SOLR
INTRAVENOUS | Status: AC
  Filled 2014-12-22: qty 10

## 2014-12-22 MED ORDER — FENTANYL BOLUS VIA INFUSION
30.0000 ug | INTRAVENOUS | Status: DC | PRN
  Filled 2014-12-22: qty 30

## 2014-12-22 MED ORDER — HYDROMORPHONE HCL 1 MG/ML IJ SOLN
1.0000 mg | INTRAMUSCULAR | Status: DC | PRN
  Administered 2014-12-23 – 2014-12-24 (×9): 1 mg via INTRAVENOUS
  Filled 2014-12-22 (×10): qty 1

## 2014-12-22 MED ORDER — PROPOFOL 1000 MG/100ML IV EMUL
INTRAVENOUS | Status: AC
  Filled 2014-12-22: qty 100

## 2014-12-22 MED ORDER — ONDANSETRON HCL 4 MG/2ML IJ SOLN: 4.0000 mg | Freq: Four times a day (QID) | INTRAMUSCULAR | Status: DC | PRN

## 2014-12-22 MED ORDER — MORPHINE SULFATE 2 MG/ML IJ SOLN
INTRAMUSCULAR | Status: AC
  Filled 2014-12-22: qty 1

## 2014-12-22 MED ORDER — PROPOFOL 10 MG/ML IV BOLUS: 0.5000 mg/kg | Freq: Once | INTRAVENOUS | Status: DC

## 2014-12-22 MED ORDER — FENTANYL CITRATE (PF) 100 MCG/2ML IJ SOLN: 50.0000 ug | Freq: Once | INTRAMUSCULAR | Status: DC

## 2014-12-22 MED ORDER — CHLORHEXIDINE GLUCONATE 0.12 % MT SOLN
15.0000 mL | Freq: Two times a day (BID) | OROMUCOSAL | Status: DC
Start: 2014-12-22 — End: 2014-12-23
  Administered 2014-12-23: 15 mL via OROMUCOSAL
  Filled 2014-12-22: qty 15

## 2014-12-22 MED ORDER — LIDOCAINE HCL (CARDIAC) 20 MG/ML IV SOLN
INTRAVENOUS | Status: AC
  Filled 2014-12-22: qty 5

## 2014-12-22 NOTE — ED Notes (Signed)
Chem-* and CG-4 reported to Dr. Manus Gunningancour
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
## 2014-12-22 NOTE — Progress Notes (Signed)
ET tube pulled back to 24 @lip  per Dr.Blackman and chest Xray. Breath sounds equal and bilateral.

## 2014-12-23 LAB — BASIC METABOLIC PANEL
Anion gap: 8 (ref 5–15)
BUN: 10 mg/dL (ref 6–20)
CALCIUM: 8.3 mg/dL — AB (ref 8.9–10.3)
CO2: 24 mmol/L (ref 22–32)
CREATININE: 0.93 mg/dL (ref 0.61–1.24)
Chloride: 106 mmol/L (ref 101–111)
GFR calc Af Amer: >60 mL/min (ref >60)
GFR calc non Af Amer: >60 mL/min (ref >60)
GLUCOSE: 98 mg/dL (ref 65–99)
Potassium: 3.9 mmol/L (ref 3.5–5.1)
SODIUM: 138 mmol/L (ref 135–145)

## 2014-12-23 LAB — MRSA PCR SCREENING: MRSA BY PCR: NEGATIVE

## 2014-12-23 LAB — CBC
HCT: 39.1 % (ref 39.0–52.0)
HEMOGLOBIN: 13.3 g/dL (ref 13.0–17.0)
MCH: 29.3 pg (ref 26.0–34.0)
MCHC: 34 g/dL (ref 30.0–36.0)
MCV: 86.1 fL (ref 78.0–100.0)
Platelets: 206 10*3/uL (ref 150–400)
RBC: 4.54 MIL/uL (ref 4.22–5.81)
RDW: 13.9 % (ref 11.5–15.5)
WBC: 11.2 10*3/uL — ABNORMAL HIGH (ref 4.0–10.5)

## 2014-12-23 LAB — TRIGLYCERIDES: Triglycerides: 164 mg/dL — ABNORMAL HIGH (ref <150)

## 2014-12-23 MED ORDER — ETOMIDATE 2 MG/ML IV SOLN
INTRAVENOUS | Status: DC | PRN
  Administered 2014-12-22: 20 mg via INTRAVENOUS

## 2014-12-23 MED ORDER — FENTANYL CITRATE (PF) 100 MCG/2ML IJ SOLN
INTRAMUSCULAR | Status: DC | PRN
  Administered 2014-12-22: 50 ug via INTRAVENOUS

## 2014-12-23 MED ORDER — SUCCINYLCHOLINE CHLORIDE 20 MG/ML IJ SOLN
INTRAMUSCULAR | Status: DC | PRN
  Administered 2014-12-22: 100 mg via INTRAVENOUS

## 2014-12-23 MED ORDER — LORAZEPAM 2 MG/ML IJ SOLN
INTRAMUSCULAR | Status: DC | PRN
Start: 2014-12-22 — End: 2014-12-24
  Administered 2014-12-22: 1 mg via INTRAVENOUS

## 2014-12-23 MED ORDER — VECURONIUM BROMIDE 10 MG IV SOLR
INTRAVENOUS | Status: DC | PRN
  Administered 2014-12-22: 10 mg via INTRAVENOUS

## 2014-12-23 MED ORDER — OXYCODONE-ACETAMINOPHEN 5-325 MG PO TABS
1.0000 | ORAL_TABLET | Freq: Four times a day (QID) | ORAL | Status: DC | PRN
  Administered 2014-12-23 – 2014-12-24 (×3): 2 via ORAL
  Filled 2014-12-23 (×3): qty 2

## 2014-12-23 MED ORDER — FENTANYL CITRATE (PF) 100 MCG/2ML IJ SOLN
INTRAMUSCULAR | Status: DC | PRN
  Administered 2014-12-22: 100 ug via INTRAVENOUS

## 2014-12-23 MED ORDER — MIDAZOLAM HCL 5 MG/5ML IJ SOLN
INTRAMUSCULAR | Status: DC | PRN
  Administered 2014-12-22: 5 mg via INTRAVENOUS

## 2014-12-23 MED ORDER — HYDROMORPHONE HCL 1 MG/ML IJ SOLN
INTRAMUSCULAR | Status: DC | PRN
  Administered 2014-12-22: 1 mg via INTRAVENOUS

## 2014-12-23 MED ORDER — PROPOFOL 1000 MG/100ML IV EMUL
INTRAVENOUS | Status: DC | PRN
  Administered 2014-12-22: 20 mg/h via INTRAVENOUS
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
## 2014-12-23 NOTE — ED Notes (Signed)
Restraints applied to pt. Pt waking up from sedation and attempting to remove ET tube.

## 2014-12-23 NOTE — Progress Notes (Signed)
I was asked to come speak with the patient regarding releasing medical information to his wife and family. He believes we alluded to his wife that he was using drugs.  Although he admits not actually hearing the MD say this to his wife she stated the MD told her.  His desire is that no information be given to anyone regarding his medical information.  I explained to him at this point he is able to use a phone and share only the information he wants with whom he wants and we will only communicate with him from this point.
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
## 2014-12-23 NOTE — ED Notes (Signed)
Pt successfully intubated. Placed 25 at the teeth, with positive color change, and breath sounds heard bilaterally.

## 2014-12-23 NOTE — Procedures (Signed)
Extubation Procedure Note  Patient Details:   Name: Natale LayDaniel Gretzinger DOB: 12/12/1985 MRN: [id number]   Airway Documentation:     Evaluation  O2 sats: stable throughout Complications: No apparent complications Patient did tolerate procedure well. Bilateral Breath Sounds: Diminished, Rhonchi Suctioning: Airway Yes   Pt extubated per MD order. Pt placed on 4l [place] with sats of 100%.  RT will continue to monitor.  Ronny FlurryMorgan B Marjarie Irion 12/23/2014, 9:02 AM

## 2014-12-24 ENCOUNTER — Emergency Department
Admission: EM | Admit: 2014-12-24 | Discharge: 2014-12-26 | Disposition: A | Discharge status: Home / Self Care | Payer: Self-pay | Attending: Emergency Medicine | Admitting: Emergency Medicine

## 2014-12-24 DIAGNOSIS — F32A Depression, unspecified: Secondary | ICD-10-CM

## 2014-12-24 DIAGNOSIS — F3181 Bipolar II disorder: Secondary | ICD-10-CM

## 2014-12-24 DIAGNOSIS — F1410 Cocaine abuse, uncomplicated: Secondary | ICD-10-CM | POA: Insufficient documentation

## 2014-12-24 DIAGNOSIS — R630 Anorexia: Secondary | ICD-10-CM | POA: Insufficient documentation

## 2014-12-24 DIAGNOSIS — F1910 Other psychoactive substance abuse, uncomplicated: Secondary | ICD-10-CM

## 2014-12-24 DIAGNOSIS — F1920 Other psychoactive substance dependence, uncomplicated: Secondary | ICD-10-CM

## 2014-12-24 DIAGNOSIS — F1494 Cocaine use, unspecified with cocaine-induced mood disorder: Secondary | ICD-10-CM

## 2014-12-24 DIAGNOSIS — F329 Major depressive disorder, single episode, unspecified: Secondary | ICD-10-CM | POA: Insufficient documentation

## 2014-12-24 DIAGNOSIS — F911 Conduct disorder, childhood-onset type: Secondary | ICD-10-CM | POA: Insufficient documentation

## 2014-12-24 DIAGNOSIS — Z720 Tobacco use: Secondary | ICD-10-CM | POA: Insufficient documentation

## 2014-12-24 DIAGNOSIS — F1310 Sedative, hypnotic or anxiolytic abuse, uncomplicated: Secondary | ICD-10-CM | POA: Insufficient documentation

## 2014-12-24 DIAGNOSIS — F1110 Opioid abuse, uncomplicated: Secondary | ICD-10-CM | POA: Insufficient documentation

## 2014-12-24 DIAGNOSIS — F419 Anxiety disorder, unspecified: Secondary | ICD-10-CM | POA: Insufficient documentation

## 2014-12-24 DIAGNOSIS — F1210 Cannabis abuse, uncomplicated: Secondary | ICD-10-CM | POA: Insufficient documentation

## 2014-12-24 HISTORY — DX: Bipolar disorder, unspecified: F31.9

## 2014-12-24 HISTORY — DX: Post-traumatic stress disorder, unspecified: F43.10

## 2014-12-24 HISTORY — DX: Anxiety disorder, unspecified: F41.9

## 2014-12-24 LAB — URINE DRUG SCREEN, QUALITATIVE (ARMC ONLY)
Amphetamines, Ur Screen: NOT DETECTED
BARBITURATES, UR SCREEN: NOT DETECTED
Benzodiazepine, Ur Scrn: POSITIVE — AB
COCAINE METABOLITE, UR ~~LOC~~: POSITIVE — AB
Cannabinoid 50 Ng, Ur ~~LOC~~: POSITIVE — AB
MDMA (ECSTASY) UR SCREEN: NOT DETECTED
METHADONE SCREEN, URINE: NOT DETECTED
Opiate, Ur Screen: POSITIVE — AB
Phencyclidine (PCP) Ur S: NOT DETECTED
TRICYCLIC, UR SCREEN: NOT DETECTED

## 2014-12-24 LAB — COMPREHENSIVE METABOLIC PANEL
ALBUMIN: 4.9 g/dL (ref 3.5–5.0)
ALT: 113 U/L — ABNORMAL HIGH (ref 17–63)
ANION GAP: 11 (ref 5–15)
AST: 71 U/L — ABNORMAL HIGH (ref 15–41)
Alkaline Phosphatase: 75 U/L (ref 38–126)
BILIRUBIN TOTAL: 1.1 mg/dL (ref 0.3–1.2)
BUN: 11 mg/dL (ref 6–20)
CO2: 24 mmol/L (ref 22–32)
Calcium: 9.5 mg/dL (ref 8.9–10.3)
Chloride: 100 mmol/L — ABNORMAL LOW (ref 101–111)
Creatinine, Ser: 0.89 mg/dL (ref 0.61–1.24)
GFR calc non Af Amer: >60 mL/min (ref >60)
Glucose, Bld: 142 mg/dL — ABNORMAL HIGH (ref 65–99)
Potassium: 3.6 mmol/L (ref 3.5–5.1)
Sodium: 135 mmol/L (ref 135–145)
Total Protein: 8.9 g/dL — ABNORMAL HIGH (ref 6.5–8.1)

## 2014-12-24 LAB — CBC
HCT: 48.7 % (ref 40.0–52.0)
HEMOGLOBIN: 16.4 g/dL (ref 13.0–18.0)
MCH: 29.5 pg (ref 26.0–34.0)
MCHC: 33.6 g/dL (ref 32.0–36.0)
MCV: 87.8 fL (ref 80.0–100.0)
Platelets: 237 10*3/uL (ref 150–440)
RBC: 5.55 MIL/uL (ref 4.40–5.90)
RDW: 14 % (ref 11.5–14.5)
WBC: 11.1 10*3/uL — AB (ref 3.8–10.6)

## 2014-12-24 LAB — SALICYLATE LEVEL: Salicylate Lvl: <4 mg/dL (ref 2.8–30.0)

## 2014-12-24 LAB — ETHANOL: Alcohol, Ethyl (B): <5 mg/dL (ref <5)

## 2014-12-24 LAB — ACETAMINOPHEN LEVEL

## 2014-12-24 MED ORDER — LORAZEPAM 1 MG PO TABS
1.0000 mg | ORAL_TABLET | Freq: Once | ORAL | Status: AC
  Administered 2014-12-24: 1 mg via ORAL

## 2014-12-24 MED ORDER — IBUPROFEN 200 MG PO TABS: 600.0000 mg | ORAL_TABLET | Freq: Four times a day (QID) | ORAL | Status: DC | PRN

## 2014-12-24 MED ORDER — LORAZEPAM 1 MG PO TABS
ORAL_TABLET | ORAL | Status: AC
  Administered 2014-12-24: 1 mg via ORAL
  Filled 2014-12-24: qty 1

## 2014-12-24 MED ORDER — LORAZEPAM 2 MG PO TABS
ORAL_TABLET | ORAL | Status: AC
  Administered 2014-12-24: 4 mg via ORAL
  Filled 2014-12-24: qty 2

## 2014-12-24 MED ORDER — CLONIDINE HCL 0.1 MG PO TABS: 0.1000 mg | ORAL_TABLET | Freq: Once | ORAL | Status: DC

## 2014-12-24 MED ORDER — LORAZEPAM 2 MG PO TABS
4.0000 mg | ORAL_TABLET | Freq: Once | ORAL | Status: AC
  Administered 2014-12-24: 4 mg via ORAL

## 2014-12-24 NOTE — ED Notes (Signed)

## 2014-12-24 NOTE — ED Notes (Signed)
BEHAVIORAL HEALTH ROUNDING Patient sleeping: Yes.   Patient alert and oriented: Sleeping  Behavior appropriate: Yes.  ; If no, describe:  Nutrition and fluids offered: Yes  Toileting and hygiene offered: Yes  Sitter present: Yes Law enforcement present: Yes   
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
## 2014-12-24 NOTE — ED Notes (Signed)
BEHAVIORAL HEALTH ROUNDING Patient sleeping: No. Patient alert and oriented: Yes Behavior appropriate: Yes.  ; If no, describe:  Nutrition and fluids offered: Yes  Toileting and hygiene offered: Yes  Sitter present: Yes Law enforcement present: Yes  
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
## 2014-12-24 NOTE — ED Notes (Signed)
Received shift hand-off report from Olivia, RN.  

## 2014-12-24 NOTE — ED Notes (Signed)

## 2014-12-24 NOTE — ED Notes (Signed)
Patient is IVC pending placement at [facility]

## 2014-12-24 NOTE — ED Notes (Signed)
BEHAVIORAL HEALTH ROUNDING Patient sleeping: No. Patient alert and oriented: yes Behavior appropriate: Yes.   Nutrition and fluids offered: Yes  Toileting and hygiene offered: Yes  Sitter present: yes Law enforcement present: Yes   Pt given lunch tray  

## 2014-12-24 NOTE — ED Notes (Signed)
BEHAVIORAL HEALTH ROUNDING Patient sleeping: Yes.   Patient alert and oriented: Sleeping  Behavior appropriate: Yes.  ; If no, describe:  Nutrition and fluids offered: Yes  Toileting and hygiene offered: Yes  Sitter present: Yes Law enforcement present: Yes

## 2014-12-24 NOTE — ED Provider Notes (Signed)
Patient was seen by Dr. Phineas RealKenner initially. Please see his H&P for details.  The patient was also seen by Dr. Guss Bundehalla. She and I discussed the case and had agreed that the patient would be held until arrangements for further care, likely at a [facility], could be arranged.  I just rechecked the patient at 1745. He was sleeping. He wakes appropriately. He is somewhat diaphoretic and looks a little uncomfortable. He complains of general pain, primarily in his left wrist and forearm which has undergone surgical repair in the past.   He confirms that he use cocaine and marijuana recently. He denies other type of drugs or narcotic withdrawal. Still, he does have a positive opiate screen on his urine drug screen along with cocaine and Zosyn and cannabis. With his diaphoresis and use of opiates in the past, I will give him 0.1 mg of clonidine right now. I will also give him 1 mg of Ativan by mouth.  Darien Ramusavid W Zakk Borgen, MD 12/24/14 [phone]
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
## 2014-12-24 NOTE — ED Notes (Signed)
Pt observed lying in bed - watching TV   Pt visualized with NAD  No verbalized needs or concerns at this time  Continue to monitor 

## 2014-12-24 NOTE — Progress Notes (Signed)
75 mg of intravenous Fentanyl, with a concentration of 2500 mcg in 250 ml, wasted in sink and witnessed by Arelia LongestJ. McDaniel, RN.
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
## 2014-12-24 NOTE — ED Notes (Signed)
Introduced self to pt. Made pt aware of current plan of care.  
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
## 2014-12-24 NOTE — Discharge Instructions (Signed)
Cleanse wound once daily Cover with a band-aid

## 2014-12-25 DIAGNOSIS — F3163 Bipolar disorder, current episode mixed, severe, without psychotic features: Secondary | ICD-10-CM

## 2014-12-25 MED ORDER — CARBAMAZEPINE 200 MG PO TABS: 200.0000 mg | ORAL_TABLET | Freq: Two times a day (BID) | ORAL | Status: DC

## 2014-12-25 MED ORDER — CARBAMAZEPINE 200 MG PO TABS
200.0000 mg | ORAL_TABLET | Freq: Three times a day (TID) | ORAL | Status: DC
  Administered 2014-12-25 – 2014-12-26 (×4): 200 mg via ORAL
  Filled 2014-12-25 (×3): qty 1

## 2014-12-25 MED ORDER — HYDROXYZINE HCL 25 MG PO TABS
50.0000 mg | ORAL_TABLET | Freq: Four times a day (QID) | ORAL | Status: DC | PRN
  Administered 2014-12-26 (×2): 50 mg via ORAL
  Filled 2014-12-25 (×2): qty 2

## 2014-12-25 MED ORDER — LORAZEPAM 1 MG PO TABS
1.0000 mg | ORAL_TABLET | Freq: Once | ORAL | Status: AC
  Administered 2014-12-25: 1 mg via ORAL

## 2014-12-25 MED ORDER — HALOPERIDOL 2 MG PO TABS
2.0000 mg | ORAL_TABLET | Freq: Three times a day (TID) | ORAL | Status: DC
  Administered 2014-12-25: 2 mg via ORAL
  Filled 2014-12-25: qty 4

## 2014-12-25 MED ORDER — LORAZEPAM 2 MG PO TABS
ORAL_TABLET | ORAL | Status: AC
  Filled 2014-12-25: qty 1

## 2014-12-25 MED ORDER — LORAZEPAM 2 MG PO TABS
2.0000 mg | ORAL_TABLET | ORAL | Status: AC
  Administered 2014-12-25: 2 mg via ORAL

## 2014-12-25 MED ORDER — HALOPERIDOL LACTATE 5 MG/ML IJ SOLN: 10.0000 mg | Freq: Four times a day (QID) | INTRAMUSCULAR | Status: DC | PRN

## 2014-12-25 MED ORDER — LORAZEPAM 1 MG PO TABS
ORAL_TABLET | ORAL | Status: AC
  Filled 2014-12-25: qty 1

## 2014-12-25 MED ORDER — CARBAMAZEPINE 200 MG PO TABS
ORAL_TABLET | ORAL | Status: AC
  Filled 2014-12-25: qty 1

## 2014-12-25 NOTE — ED Notes (Signed)

## 2014-12-25 NOTE — ED Notes (Signed)
Pt on the phone with wife at this time. No concerns noted, pt tolerating well. Calm and cooperative at this time.

## 2014-12-25 NOTE — ED Notes (Signed)
Pt observed resting in bed. Pt visualized with NAD. No verbalized needs or concerns at this time. Continue to monitor  

## 2014-12-25 NOTE — ED Provider Notes (Signed)
-----------------------------------------   4:28 AM on 12/25/2014 -----------------------------------------  Patient remains hemodynamically stable and medically stable. He remains under IVC and is awaiting placement for further psychiatric management. No events overnight.  Sharman CheekPhillip Jordane Hisle, MD 12/25/14 [phone]

## 2014-12-25 NOTE — ED Notes (Signed)
meds given per md order, meal tray given

## 2014-12-25 NOTE — ED Notes (Signed)
BEHAVIORAL HEALTH ROUNDING  Patient sleeping: Yes.  Patient alert and oriented: Sleeping  Behavior appropriate: Yes. ; If no, describe:  Nutrition and fluids offered: Sleeping  Toileting and hygiene offered: Sleeping  Sitter present: Yes  Law enforcement present: Yes   

## 2014-12-25 NOTE — ED Notes (Signed)
BEHAVIORAL HEALTH ROUNDING Patient sleeping: No. Patient alert and oriented: yes Behavior appropriate: Yes.  ;  Nutrition and fluids offered: Yes  Toileting and hygiene offered: Yes  Sitter present: yes Law enforcement present: Yes  

## 2014-12-25 NOTE — ED Notes (Signed)

## 2014-12-25 NOTE — ED Notes (Signed)
MD Faheem at bedside at this time

## 2014-12-25 NOTE — ED Notes (Signed)
BEHAVIORAL HEALTH ROUNDING Patient sleeping: Yes.   Patient alert and oriented: yes Behavior appropriate: Yes.  ; If no, describe:  Nutrition and fluids offered: Yes  Toileting and hygiene offered: Yes  Sitter present: no Law enforcement present: Yes  

## 2014-12-25 NOTE — ED Notes (Signed)
BEHAVIORAL HEALTH ROUNDING Patient sleeping: No. Patient alert and oriented: yes Behavior appropriate: Yes.  ; If no, describe:  Nutrition and fluids offered: Yes  Toileting and hygiene offered: Yes  Sitter present: no Law enforcement present: Yes  

## 2014-12-25 NOTE — ED Notes (Signed)
Pt is requesting something for anxiety.Marland Kitchen. He is slightly aggitated. Dr Fanny BienQuale aware  New order recieved

## 2014-12-25 NOTE — ED Notes (Addendum)
BEHAVIORAL HEALTH ROUNDING Patient sleeping: Yes.   Patient alert and oriented: yes Behavior appropriate: Yes.  ; If no, describe:  Nutrition and fluids offered: No - sleeping Toileting and hygiene offered: No - sleeping Sitter present: no Law enforcement present: Yes  

## 2014-12-25 NOTE — ED Notes (Signed)
Pt getting agitated at this time because his wife is unable to visit during visiting hours. Pt requests to have special visit after 2100 after wife gets off work and can drive here. Wife works 11a-8p and that is the only time she can visit is after work. Pt was given paper regarding visiting hours and became agitated and worked up. Pt educated again regarding visiting hours and still requesting a special visit. PD present during encounter, Charge RN Tammy SoursGreg made aware. No further orders given at this time. Pt took meds with no concern after being talked down, pt sitting up in bed eating dinner at this time.

## 2014-12-25 NOTE — ED Notes (Signed)
Pt observed lying in bed - watching TV   Pt visualized with NAD  No verbalized needs or concerns at this time  Continue to monitor 

## 2014-12-25 NOTE — ED Notes (Signed)

## 2014-12-25 NOTE — ED Notes (Signed)

## 2014-12-25 NOTE — ED Notes (Signed)
Pt requesting something for sleep, med given per md order

## 2014-12-25 NOTE — ED Notes (Signed)
BEHAVIORAL HEALTH ROUNDING Patient sleeping: Yes.   Patient alert and oriented: yes Behavior appropriate: Yes.  ; If no, describe:  Nutrition and fluids offered: No - sleeping Toileting and hygiene offered: No - sleeping Sitter present: no Law enforcement present: Yes  
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
## 2014-12-25 NOTE — ED Notes (Signed)
Pt transferred into ED BHU room 3   Patient assigned to appropriate care area. Patient oriented to unit/care area: Informed that, for their safety, care areas are designed for safety and monitored by security cameras at all times; Visiting hours and phone times explained to patient. Patient verbalizes understanding, and verbal contract for safety obtained.     Pt insisting that he will be using the phone at 2015 tonight cause he has to call his wife - pt informed that the phone will close at 1900 and reopen at 0900   Pt states  "i will get to use the phone - i do not care what the phone hours are  - they do not apply to me."

## 2014-12-25 NOTE — ED Notes (Signed)
Pt had a phone call from father..states he doesn't want to speak to him ..Marland Kitchen

## 2014-12-25 NOTE — ED Notes (Signed)
Report received from end-shift RN. Patient care assumed. Patient/RN introduction complete. Will continue to monitor.  

## 2014-12-25 NOTE — ED Notes (Signed)
Pt laying in bed.  

## 2014-12-25 NOTE — ED Notes (Signed)
Pt sitting in room watching tv, calm and cooperative at this time without complaints, will continue to monitor.

## 2014-12-25 NOTE — ED Notes (Signed)
Transfer to BHU  

## 2014-12-26 DIAGNOSIS — F1920 Other psychoactive substance dependence, uncomplicated: Secondary | ICD-10-CM

## 2014-12-26 DIAGNOSIS — F3181 Bipolar II disorder: Secondary | ICD-10-CM

## 2014-12-26 DIAGNOSIS — F1494 Cocaine use, unspecified with cocaine-induced mood disorder: Secondary | ICD-10-CM

## 2014-12-26 NOTE — ED Notes (Signed)
No change in condition will continue to monitor  

## 2014-12-26 NOTE — ED Notes (Signed)
BEHAVIORAL HEALTH ROUNDING Patient sleeping: No. Patient alert and oriented: yes Behavior appropriate: Yes.  ; If no, describe:  Nutrition and fluids offered: Yes  Toileting and hygiene offered: Yes  Sitter present: yes Law enforcement present: Yes  

## 2014-12-26 NOTE — Progress Notes (Signed)
LCSW met with patient and he reported he was not suicidal or homicidal or hearing or seeing things. Patient will follow up with [facility] program. Reviewed suicide prevention and intervention with patient and handout provided. Consulted with Dr Clapacs/Dr Quale and patient discharge documentation will be completed. [facility] handouts will be given to patients by ED nurses. 

## 2014-12-26 NOTE — ED Notes (Signed)
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
## 2014-12-26 NOTE — ED Notes (Signed)
IVC/Pending placement/All paper work on chart 

## 2014-12-26 NOTE — Discharge Instructions (Signed)
Polysubstance Abuse °When people abuse more than one drug or type of drug it is called polysubstance or polydrug abuse. For example, many smokers also drink alcohol. This is one form of polydrug abuse. Polydrug abuse also refers to the use of a drug to counteract an unpleasant effect produced by another drug. It may also be used to help with withdrawal from another drug. People who take stimulants may become agitated. Sometimes this agitation is countered with a tranquilizer. This helps protect against the unpleasant side effects. Polydrug abuse also refers to the use of different drugs at the same time.  °Anytime drug use is interfering with normal living activities, it has become abuse. This includes problems with family and friends. Psychological dependence has developed when your mind tells you that the drug is needed. This is usually followed by physical dependence which has developed when continuing increases of drug are required to get the same feeling or "high". This is known as addiction or chemical dependency. A person's risk is much higher if there is a history of chemical dependency in the family. °SIGNS OF CHEMICAL DEPENDENCY °· You have been told by friends or family that drugs have become a problem. °· You fight when using drugs. °· You are having blackouts (not remembering what you do while using). °· You feel sick from using drugs but continue using. °· You lie about use or amounts of drugs (chemicals) used. °· You need chemicals to get you going. °· You are suffering in work performance or in school because of drug use. °· You get sick from use of drugs but continue to use anyway. °· You need drugs to relate to people or feel comfortable in social situations. °· You use drugs to forget problems. °"Yes" answered to any of the above signs of chemical dependency indicates there are problems. The longer the use of drugs continues, the greater the problems will become. °If there is a family history of  drug or alcohol use, it is best not to experiment with these drugs. Continual use leads to tolerance. After tolerance develops more of the drug is needed to get the same feeling. This is followed by addiction. With addiction, drugs become the most important part of life. It becomes more important to take drugs than participate in the other usual activities of life. This includes relating to friends and family. Addiction is followed by dependency. Dependency is a condition where drugs are now needed not just to get high, but to feel normal. °Addiction cannot be cured but it can be stopped. This often requires outside help and the care of professionals. Treatment centers are listed in the yellow pages under: Cocaine, Narcotics, and Alcoholics Anonymous. Most hospitals and clinics can refer you to a specialized care center. Talk to your caregiver if you need help. °Document Released: 02/26/2005 Document Revised: 09/29/2011 Document Reviewed: 07/07/2005 °Exit[employer]® Patient Information ©2015 [employer]. This information is not intended to replace advice given to you by your health care provider. Make sure you discuss any questions you have with your health care provider. ° °

## 2014-12-26 NOTE — ED Provider Notes (Signed)
-----------------------------------------   1:43 PM on 12/26/2014 -----------------------------------------   BP 105/50 mmHg  Pulse 73  Temp(Src) 98 F (36.7 C) (Oral)  Resp 18  Ht 6\' 2"  (1.88 m)  Wt 240 lb (108.863 kg)  BMI 30.80 kg/m2  SpO2 99%  The patient had no acute events since last update.  Calm and cooperative at this time.  Disposition is pending per Psychiatry/Behavioral Medicine team recommendations. Continue to wait for appropriate placement into a facility such as [facility].       Minna AntisKevin Rithwik Schmieg, MD 12/26/14 1343

## 2014-12-26 NOTE — ED Notes (Signed)
BEHAVIORAL HEALTH ROUNDING Patient sleeping: Yes.   Patient alert and oriented: not applicable Behavior appropriate: Yes.  ; If no, describe:  Nutrition and fluids offered: No Toileting and hygiene offered: No Sitter present: yes Law enforcement present: Yes   ENVIRONMENTAL ASSESSMENT Potentially harmful objects out of patient reach: Yes.   Personal belongings secured: Yes.   Patient dressed in hospital provided attire only: Yes.   Plastic bags out of patient reach: Yes.   Patient care equipment (cords, cables, call bells, lines, and drains) shortened, removed, or accounted for: Yes.   Equipment and supplies removed from bottom of stretcher: Yes.   Potentially toxic materials out of patient reach: Yes.   Sharps container removed or out of patient reach: Yes.    

## 2014-12-26 NOTE — ED Notes (Signed)
Pt laying in bed.  

## 2015-06-24 ENCOUNTER — Encounter: Payer: Self-pay | Admitting: *Deleted

## 2015-06-24 ENCOUNTER — Emergency Department
Admission: EM | Admit: 2015-06-24 | Discharge: 2015-06-25 | Disposition: A | Discharge status: Other Acute Inpatient Hospital | Payer: Self-pay | Attending: Emergency Medicine | Admitting: Emergency Medicine

## 2015-06-24 DIAGNOSIS — Z79899 Other long term (current) drug therapy: Secondary | ICD-10-CM | POA: Insufficient documentation

## 2015-06-24 DIAGNOSIS — F3181 Bipolar II disorder: Secondary | ICD-10-CM | POA: Insufficient documentation

## 2015-06-24 DIAGNOSIS — F32A Depression, unspecified: Secondary | ICD-10-CM

## 2015-06-24 DIAGNOSIS — F329 Major depressive disorder, single episode, unspecified: Secondary | ICD-10-CM

## 2015-06-24 DIAGNOSIS — F1414 Cocaine abuse with cocaine-induced mood disorder: Secondary | ICD-10-CM | POA: Insufficient documentation

## 2015-06-24 DIAGNOSIS — F17210 Nicotine dependence, cigarettes, uncomplicated: Secondary | ICD-10-CM | POA: Insufficient documentation

## 2015-06-24 DIAGNOSIS — F419 Anxiety disorder, unspecified: Secondary | ICD-10-CM | POA: Insufficient documentation

## 2015-06-24 HISTORY — DX: Neuromuscular dysfunction of bladder, unspecified: N31.9

## 2015-06-24 HISTORY — DX: Accidental discharge from unspecified firearms or gun, initial encounter: W34.00XA

## 2015-06-24 LAB — COMPREHENSIVE METABOLIC PANEL
ALBUMIN: 4.2 g/dL (ref 3.5–5.0)
ALK PHOS: 71 U/L (ref 38–126)
ALT: 31 U/L (ref 17–63)
AST: 22 U/L (ref 15–41)
Anion gap: 7 (ref 5–15)
BUN: 13 mg/dL (ref 6–20)
CALCIUM: 9.2 mg/dL (ref 8.9–10.3)
CHLORIDE: 107 mmol/L (ref 101–111)
CO2: 25 mmol/L (ref 22–32)
CREATININE: 0.86 mg/dL (ref 0.61–1.24)
GFR calc Af Amer: >60 mL/min (ref >60)
GFR calc non Af Amer: >60 mL/min (ref >60)
GLUCOSE: 120 mg/dL — AB (ref 65–99)
Potassium: 3.7 mmol/L (ref 3.5–5.1)
SODIUM: 139 mmol/L (ref 135–145)
Total Bilirubin: 0.6 mg/dL (ref 0.3–1.2)
Total Protein: 7.6 g/dL (ref 6.5–8.1)

## 2015-06-24 LAB — ACETAMINOPHEN LEVEL: Acetaminophen (Tylenol), Serum: <10 ug/mL — ABNORMAL LOW (ref 10–30)

## 2015-06-24 LAB — SALICYLATE LEVEL: Salicylate Lvl: <4 mg/dL (ref 2.8–30.0)

## 2015-06-24 LAB — CBC
HEMATOCRIT: 45.9 % (ref 40.0–52.0)
HEMOGLOBIN: 15.4 g/dL (ref 13.0–18.0)
MCH: 29.7 pg (ref 26.0–34.0)
MCHC: 33.5 g/dL (ref 32.0–36.0)
MCV: 88.6 fL (ref 80.0–100.0)
Platelets: 220 10*3/uL (ref 150–440)
RBC: 5.18 MIL/uL (ref 4.40–5.90)
RDW: 13.7 % (ref 11.5–14.5)
WBC: 9.8 10*3/uL (ref 3.8–10.6)

## 2015-06-24 LAB — ETHANOL: Alcohol, Ethyl (B): 8 mg/dL — ABNORMAL HIGH (ref <5)

## 2015-06-24 MED ORDER — LORAZEPAM 2 MG/ML IJ SOLN
2.0000 mg | Freq: Once | INTRAMUSCULAR | Status: AC
  Administered 2015-06-24: 2 mg via INTRAMUSCULAR
  Filled 2015-06-24: qty 1

## 2015-06-24 MED ORDER — HALOPERIDOL LACTATE 5 MG/ML IJ SOLN
5.0000 mg | Freq: Once | INTRAMUSCULAR | Status: AC
  Administered 2015-06-24: 5 mg via INTRAMUSCULAR
  Filled 2015-06-24: qty 1

## 2015-06-24 NOTE — ED Notes (Signed)
Patient assigned to appropriate care area. Patient oriented to unit/care area: Informed that, for their safety, care areas are designed for safety and monitored by security cameras at all times; and visiting hours explained to patient. Patient verbalizes understanding, and verbal contract for safety obtained. 
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
## 2015-06-24 NOTE — ED Notes (Signed)
Meal given to Pt.

## 2015-06-24 NOTE — [facility] Counselor (Signed)
Writer unable to maintain Pt arousal for completion of assessment. Will attempt to complete at later time.
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
## 2015-06-25 ENCOUNTER — Inpatient Hospital Stay
Admit: 2015-06-25 | Discharge: 2015-06-29 | DRG: 897 | Disposition: A | Discharge status: Home / Self Care | Payer: No Typology Code available for payment source | Source: Intra-hospital | Attending: Psychiatry | Admitting: Psychiatry

## 2015-06-25 DIAGNOSIS — F3181 Bipolar II disorder: Secondary | ICD-10-CM | POA: Diagnosis present

## 2015-06-25 DIAGNOSIS — F17200 Nicotine dependence, unspecified, uncomplicated: Secondary | ICD-10-CM | POA: Diagnosis present

## 2015-06-25 DIAGNOSIS — F1414 Cocaine abuse with cocaine-induced mood disorder: Secondary | ICD-10-CM | POA: Diagnosis present

## 2015-06-25 DIAGNOSIS — F1494 Cocaine use, unspecified with cocaine-induced mood disorder: Secondary | ICD-10-CM | POA: Diagnosis present

## 2015-06-25 DIAGNOSIS — F1420 Cocaine dependence, uncomplicated: Secondary | ICD-10-CM | POA: Diagnosis present

## 2015-06-25 DIAGNOSIS — Z888 Allergy status to other drugs, medicaments and biological substances status: Secondary | ICD-10-CM | POA: Diagnosis not present

## 2015-06-25 DIAGNOSIS — R45851 Suicidal ideations: Secondary | ICD-10-CM | POA: Diagnosis present

## 2015-06-25 DIAGNOSIS — F410 Panic disorder [episodic paroxysmal anxiety] without agoraphobia: Secondary | ICD-10-CM | POA: Diagnosis present

## 2015-06-25 LAB — URINE DRUG SCREEN, QUALITATIVE (ARMC ONLY)
AMPHETAMINES, UR SCREEN: NOT DETECTED
BENZODIAZEPINE, UR SCRN: NOT DETECTED
Barbiturates, Ur Screen: NOT DETECTED
Cannabinoid 50 Ng, Ur ~~LOC~~: NOT DETECTED
Cocaine Metabolite,Ur ~~LOC~~: POSITIVE — AB
MDMA (ECSTASY) UR SCREEN: NOT DETECTED
METHADONE SCREEN, URINE: NOT DETECTED
Opiate, Ur Screen: NOT DETECTED
Phencyclidine (PCP) Ur S: NOT DETECTED
TRICYCLIC, UR SCREEN: NOT DETECTED

## 2015-06-25 MED ORDER — HYDROXYZINE HCL 50 MG/ML IM SOLN
50.0000 mg | Freq: Three times a day (TID) | INTRAMUSCULAR | Status: DC | PRN
  Filled 2015-06-25: qty 1

## 2015-06-25 MED ORDER — CARBAMAZEPINE 200 MG PO TABS
200.0000 mg | ORAL_TABLET | Freq: Two times a day (BID) | ORAL | Status: DC
  Administered 2015-06-25 (×2): 200 mg via ORAL
  Filled 2015-06-25 (×2): qty 1

## 2015-06-25 MED ORDER — TRAZODONE HCL 100 MG PO TABS: 100.0000 mg | ORAL_TABLET | Freq: Every evening | ORAL | Status: DC | PRN

## 2015-06-25 NOTE — [facility] Counselor (Signed)
TTS 2nd attempt- Pt sleeping and still unable to arouse

## 2015-06-25 NOTE — ED Provider Notes (Signed)
-----------------------------------------   6:41 AM on 06/25/2015 -----------------------------------------   Blood pressure 130/62, pulse 88, temperature 98.4 F (36.9 C), temperature source Oral, resp. rate 24, height 6\' 3"  (1.905 m), weight 215 lb (97.523 kg), SpO2 96 %.  The patient had no acute events since last update.  Calm and cooperative at this time.  Disposition is pending per Psychiatry/Behavioral Medicine team recommendations.     Irean HongJade J Yoanna Jurczyk, MD 06/25/15 [phone]

## 2015-06-25 NOTE — ED Notes (Signed)
Pt given snack with sprite 
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
## 2015-06-25 NOTE — ED Notes (Signed)
Patient moved to room #6 secondary to being disturbed by yelling patient across from his previous room.

## 2015-06-25 NOTE — ED Notes (Signed)
Pt had been sleeping soundly, snoring at times. Woke up for dinner and still hungry after eating dinner. Sandwich requested and given.
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
## 2015-06-25 NOTE — ED Notes (Signed)
Report received from Ruth RN.

## 2015-06-25 NOTE — ED Notes (Signed)
Appears more alert and awake than previously. Pleasant and cooperative. Wants to use phone when other pt is done.

## 2015-06-26 DIAGNOSIS — F1420 Cocaine dependence, uncomplicated: Secondary | ICD-10-CM | POA: Diagnosis present

## 2015-06-26 MED ORDER — TRAZODONE HCL 100 MG PO TABS
100.0000 mg | ORAL_TABLET | Freq: Every day | ORAL | Status: DC
  Administered 2015-06-26 – 2015-06-28 (×3): 100 mg via ORAL
  Filled 2015-06-26 (×4): qty 1

## 2015-06-26 MED ORDER — HYDROXYZINE HCL 50 MG/ML IM SOLN
50.0000 mg | Freq: Four times a day (QID) | INTRAMUSCULAR | Status: DC | PRN
  Administered 2015-06-26: 50 mg via INTRAMUSCULAR
  Filled 2015-06-26 (×2): qty 1

## 2015-06-26 MED ORDER — CARBAMAZEPINE 200 MG PO TABS
200.0000 mg | ORAL_TABLET | Freq: Every day | ORAL | Status: DC
  Administered 2015-06-26: 200 mg via ORAL
  Filled 2015-06-26 (×2): qty 1

## 2015-06-26 MED ORDER — IBUPROFEN 800 MG PO TABS: 800.0000 mg | ORAL_TABLET | Freq: Four times a day (QID) | ORAL | Status: DC | PRN

## 2015-06-26 NOTE — [facility] Group Notes (Signed)
[facility] LCSW Group Therapy  06/26/2015 5:05 PM  Type of Therapy:  Group Therapy  Participation Level:  Minimal  Participation Quality:  Attentive and Resistant  Affect:  Irritable  Cognitive:  Alert and Oriented  Insight:  Defensive and Lacking  Engagement in Therapy:  Lacking  Modes of Intervention:  Socialization and Support  Summary of Progress/Problems:Patient attended and only participated in an introductory exercise. Patient was attentive throughout group discussion. Patient was not able to identify changes he wanted to make with his life.    [name], Araiya Tilmon T, MSW, LCSWA 06/26/2015, 5:05 PM

## 2015-06-26 NOTE — Tx Team (Signed)
Interdisciplinary Treatment Plan Update (Adult)  Date:  06/26/2015 Time Reviewed:  4:08 PM  Progress in Treatment: Attending groups: Yes. Participating in groups:  Yes. and As evidenced by:  his minimal participation Taking medication as prescribed:  Yes. Tolerating medication:  Yes. Family/Significant othe contact made:  No, will contact:  if patient provides consent Patient understands diagnosis:  Yes. Discussing patient identified problems/goals with staff:  Yes. Medical problems stabilized or resolved:  Yes. Denies suicidal/homicidal ideation: Yes. Issues/concerns per patient self-inventory:  Yes. Other:  New problem(s) identified: No, Describe:  none reported  Discharge Plan or Barriers: Patient will stabilize on medications and discharge home and will need follow up outpatient services scheduled in [facility]. Patient will need Medication Management application  Reason for Continuation of Hospitalization: Depression Homicidal ideation Suicidal ideation  Comments:  Estimated length of stay: up to 3 days with expected discharge 06/29/15  New goal(s):  Review of initial/current patient goals per problem list:   1.  Goal(s): Participate in care planning  Met:  No  Target date:by discahrge  2.  Goal (s): eliminate SI/HI  Met:  Yes  Target date:by discahrge  3.  Goal(s):decrease depression  Met:  No  Target date:by discahrge  Attendees: Physician:  Camie Patience, MD 12/6/20164:08 PM  Nursing:   Terressa Koyanagi, RN 12/6/20164:08 PM  Other:  Carmell Austria, LCSWA 12/6/20164:08 PM  Other:  Tilden Fossa, [place] 12/6/20164:08 PM  Other:  Marylou Flesher, LCSWA 12/6/20164:08 PM  Other: Dossie Arbour, LCSW 12/6/20164:08 PM  Other:  12/6/20164:08 PM  Other:  12/6/20164:08 PM  Other:  12/6/20164:08 PM  Other:  12/6/20164:08 PM  Other:  12/6/20164:08 PM  Other:   12/6/20164:08 PM   Scribe for Treatment Team:   Keene Breath, MSW, LCSWA  [phone]   06/26/2015, 4:08 PM
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
## 2015-06-26 NOTE — Progress Notes (Signed)
Patient is irritable and sarcastic. Attends group.  Vistaril 50 mg IM given as ordered PRN for itching. Tolerates po bedtime medications well. Denies SI/HI/AV/H. No c/o pain/discomfort noted.

## 2015-06-26 NOTE — Plan of Care (Signed)
Problem: Alteration in mood & ability to function due to Goal: STG-Patient will comply with prescribed medication regimen (Patient will comply with prescribed medication regimen)  Outcome: Not Applicable Date Met:  31/74/09 Pt is irritable. Med compliant. No injuries noted. No voiced thoughts of hurting herself. q 15 min checks maintained for safety.

## 2015-06-26 NOTE — Progress Notes (Signed)
D:  Per pt self inventory pt reports not sleeping well last night, states that he has "not been sleeping for the past 10 days and it has caught up with me", appetite high, energy level low, ability to pay attention low, endorses passive SI, contracts for safety, pt is very tired today, goal is to catch up on sleep and eating because pt states that he has lost 30 lbs in the past 10 days.   A:  Emotional support provided, Encouraged pt to continue with treatment plan and attend all group activities starting with wrap up group tonight, q15 min checks maintained for safety.  R:  Pt is pleasant during interaction, disheveled appearance, cooperative with staff and other patients, not able to participate in groups due to having difficulty staying awake, pt states that he has been on a crack binge for the past 10 days, pt is hoping to follow up with [facility] as he has in the past after discharge
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
## 2015-06-26 NOTE — [facility] Group Notes (Signed)
[facility] Group Notes:  (Nursing/MHT/Case Management/Adjunct)  Date:  06/26/2015  Time:  2:44 PM  Type of Therapy:  Psychoeducational Skills  Participation Level:  Did Not Attend  Pa  Mickey Farberamela M Moses Ellison 06/26/2015, 2:44 PM

## 2015-06-26 NOTE — Progress Notes (Signed)
Recreation Therapy Notes  INPATIENT RECREATION THERAPY ASSESSMENT  Patient Details Name: [name] MRN: [id number] DOB: 08/22/1985 Today's Date: 06/26/2015  Patient Stressors:  Patient reported no stressors.  Coping Skills:   Isolate, Arguments, Substance Abuse, Avoidance, Exercise, Other (Comment) (Play with animals, go for a ride)  Personal Challenges: Anger, Communication, Problem-Solving, Social Interaction, Stress Management, Substance Abuse, Time Management, Trusting Others  Leisure Interests (2+):  Nature - ArchitectHunting, Individual - Other (Comment) (Work on old cars)  Biochemist, clinicalAwareness of Community Resources:  Yes  Community Resources:  [employer], [place], [place]  Current Use: No  If no, Barriers?: Other (Comment), Social (Doesn't care for them, too many people)  Patient Strengths:  Good husband, good father  Patient Identified Areas of Improvement:  Anger, being able to deal with groups of people  Current Recreation Participation:  Hunting  Patient Goal for Hospitalization:  To get substance abuse under control  [place]  County of Residence:  [place]   Current SI (including self-harm):  No  Current HI:  No  Consent to Intern Participation: N/A   Jacquelynn CreeGreene,Miyah Hampshire M, LRT/CTRS 06/26/2015, 4:48 PM

## 2015-06-26 NOTE — Progress Notes (Signed)
Recreation Therapy Notes  Date: 12.06.16 Time: 3:20 pm Location: Craft Room  Group Topic: Goal Setting  Goal Area(s) Addresses:  Patient will write at least one goal. Patient will write at least one obstacle.  Behavioral Response: Did not attend  Intervention: Recovery Goal Chart  Activity: Patients were instructed to make a Recovery Goal Chart including goals, obstacles, the date they started working on their goals, and the date they achieved their goal.  Education: LRT educated patients on healthy ways they can celebrate reaching their goals.  Education Outcome: Patient did not attend group.  Clinical Observations/Feedback: Patient did not attend group.  Jacquelynn CreeGreene,Kaelynn Igo M, LRT/CTRS 06/26/2015 4:07 PM

## 2015-06-26 NOTE — Tx Team (Signed)
Initial Interdisciplinary Treatment Plan   PATIENT STRESSORS: Substance abuse   PATIENT STRENGTHS: Ability for insight Average or above average intelligence Financial means Work skills   PROBLEM LIST: Problem List/Patient Goals Date to be addressed Date deferred Reason deferred Estimated date of resolution  Cocaine Abuse 06/26/15     Suicidal Ideations 06/26/15                                                DISCHARGE CRITERIA:  Medical problems require only outpatient monitoring  PRELIMINARY DISCHARGE PLAN: Outpatient therapy  PATIENT/FAMIILY INVOLVEMENT: This treatment plan has been presented to and reviewed with the patient, [name], and/or family member.  The patient and family have been given the opportunity to ask questions and make suggestions.  [name] 06/26/2015, 1:58 AM

## 2015-06-27 DIAGNOSIS — F3181 Bipolar II disorder: Secondary | ICD-10-CM

## 2015-06-27 MED ORDER — CARBAMAZEPINE 200 MG PO TABS
200.0000 mg | ORAL_TABLET | Freq: Three times a day (TID) | ORAL | Status: DC
  Administered 2015-06-27 – 2015-06-29 (×5): 200 mg via ORAL
  Filled 2015-06-27 (×6): qty 1
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
## 2015-06-27 NOTE — Plan of Care (Signed)
Problem: Ineffective individual coping Goal: STG: Patient will remain free from self harm Outcome: Progressing Pt safe on unit at this time   Problem: Alteration in mood & ability to function due to Goal: LTG-Pt reports reduction in suicidal thoughts (Patient reports reduction in suicidal thoughts and is able to verbalize a safety plan for whenever patient is feeling suicidal)  Outcome: Not Progressing Pt passive SI-contracts for safety

## 2015-06-27 NOTE — [facility] Group Notes (Signed)
[facility] Group Notes:  (Nursing/MHT/Case Management/Adjunct)  Date:  06/27/2015  Time:  10:10 PM  Type of Therapy:  Group Therapy  Participation Level:  Active  Participation Quality:  Appropriate  Affect:  Appropriate  Cognitive:  Appropriate  Insight:  Good  Engagement in Group:  Engaged  Modes of Intervention:  Discussion  Summary of Progress/Problems:  [name] 06/27/2015, 10:10 PM

## 2015-06-27 NOTE — Progress Notes (Signed)
D: Pt passive SI-contracts for safety.  denies HI/AVH. Pt is  cooperative. Pt irritable on approach, but pt nice to Clinical research associatewriter after talking.   A: Pt was offered support and encouragement. Pt was given scheduled medications. Pt was encourage to attend groups. Q 15 minute checks were done for safety.   R:Pt attends groups and interacts well with peers and staff. Pt is taking medication. Pt has no complaints at this time .Pt receptive to treatment and safety maintained on unit.

## 2015-06-27 NOTE — Plan of Care (Signed)
Problem: Ineffective individual coping Goal: STG: Patient will remain free from self harm Outcome: Progressing No self harm reported or observed. Pt remains safe on unit

## 2015-06-27 NOTE — Plan of Care (Signed)
Problem: [facility] Participation in Recreation Therapeutic Interventions Goal: STG-Patient will identify at least five coping skills for ** STG: Coping Skills - Within 4 treatment sessions, patient will verbalize at least 5 coping skills for anger in each of 2 treatment sessions to increase anger management skills post d/c.  Outcome: Progressing Treatment Session 1; Completed 1 out of 2: At approximately 1:55 pm, LRT met with patient in consultation room. Patient verbalized 5 coping skills for anger. Patient verbalized what triggers him to get angry, how his body responds to anger, and how he is going to remember to use his healthy coping skills. LRT provided suggestions as well. Intervention Used: Coping Skills worksheet  Leonette Monarch, LRT/CTRS 12.07.16 4:57 pm Goal: STG-Other Recreation Therapy Goal (Specify) STG: Stress Management - Within 4 treatment sessions, patient will verbalize understanding of the stress management techniques in each of 2 treatment sessions to increase stress management skills post d/c.  Outcome: Progressing Treatment Session 1; Completed 1 out of 2: At approximately 1:55 pm, LRT met with patient in consultation room. LRT educated and provided patient with handouts on stress management techniques. Patient verbalized understanding. LRT encouraged patient read over and practice the stress management techniques. Intervention Used: Stress Management handouts  Leonette Monarch, LRT/CTRS 12.07.16 4:59 pm  Problem: [facility] Participation in Recreation Therapeutic Interventions Goal: STG-Patient will identify at least five coping skills for ** STG: Coping Skills - Within 4 treatment sessions, patient will verbalize at least 5 coping skills for substance abuse in each of 2 treatment sessions to decrease substance abuse post d/c.  Outcome: Progressing Treatment Session 1; Completed 1 out of 2: At approximately 1:55 pm, LRT met with patient in consultation room. Patient verbalized  5 coping skills for substance abuse. LRT educated patient on leisure and why it is important to implement it into her schedule. LRT educated and provided patient with blank schedules to help him plan his day and try to avoid using substances. LRT educated patient on healthy support systems. Intervention Used: Coping Skills worksheet  Leonette Monarch, LRT/CTRS 12.07.16 5:01 pm

## 2015-06-27 NOTE — Progress Notes (Signed)
Recreation Therapy Notes  Date: 12.07.16 Time: 3:00 pm Location: Craft Room  Group Topic: Self-esteem  Goal Area(s) Addresses:  Patient will be able to identify benefit of self-esteem. Patient will be able to identify ways to increase self-esteem.  Behavioral Response: Did not attend  Intervention: Self-Protrait  Activity: Patients were given blank face worksheets and instructed to draw their self-portrait of how they were feeling. Patients were then given construction paper and instructed to write their first name and something positive about themselves. Patients passed the papers around group to write positive traits about peers. Patients drew their self-portrait again after reading the positive comments from their peers.  Education: LRT educated patients on additional ways to increase their self-esteem.  Education Outcome: Patient did not attend group.  Clinical Observations/Feedback: Patient did not attend group.  Jacquelynn CreeGreene,Annella Prowell M, LRT/CTRS 06/27/2015 4:26 PM

## 2015-06-27 NOTE — Progress Notes (Signed)
Pt irritable. Attends group. Minimal interaction with staff and peers. WIll continue to assess and monitor for safety

## 2015-06-27 NOTE — [facility] Group Notes (Signed)
[facility] LCSW Aftercare Discharge Planning Group Note   06/27/2015 3:49 PM  Participation Quality:  Did not attend.   Kelsie Kramp L Niaya Hickok MSW, LCSWA    

## 2015-06-27 NOTE — [facility] Group Notes (Signed)
[facility] Group Notes:  (Nursing/MHT/Case Management/Adjunct)  Date:  06/27/2015  Time:  2:06 PM  Type of Therapy:  Psychoeducational Skills  Participation Level:  Did Not Attend   Lynelle SmokeCara Travis Mccamey HospitalMadoni 06/27/2015, 2:06 PM
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
## 2015-06-28 MED ORDER — HYDROXYZINE HCL 50 MG PO TABS
50.0000 mg | ORAL_TABLET | Freq: Four times a day (QID) | ORAL | Status: DC | PRN
  Administered 2015-06-28 (×2): 50 mg via ORAL
  Filled 2015-06-28 (×2): qty 1

## 2015-06-28 NOTE — Progress Notes (Signed)
Recreation Therapy Notes  Date: 12.08.16 Time: 3:00 pm Location: Craft Room  Group Topic: Leisure Education/Coping Skills  Goal Area(s) Addresses:  Patient will identify things they are grateful for. Patient will identify how being grateful can influence decision making.  Behavioral Response: Attentive, Interactive  Intervention: Grateful Wheel  Activity: Patients were given an I Am Grateful For worksheet and instructed to list thing they are grateful for under each category.  Education: LRT educated patient on why it is important to think of thing they are grateful for.  Education Outcome: Acknowledges education/In group clarification offered  Clinical Observations/Feedback: Patient completed activity by writing at least one thing he is grateful for under each category except two. Patient contributed to group discussion.  Jacquelynn CreeGreene,Justis Closser M, LRT/CTRS 06/28/2015 5:31 PM

## 2015-06-28 NOTE — Plan of Care (Signed)
Problem: [facility] Participation in Recreation Therapeutic Interventions Goal: STG-Patient will identify at least five coping skills for ** STG: Coping Skills - Within 4 treatment sessions, patient will verbalize at least 5 coping skills for anger in each of 2 treatment sessions to increase anger management skills post d/c.  Outcome: Completed/Met Date Met:  06/28/15 Treatment Session 2; Completed 2 out of 2: At approximately 2:00 pm, LRT met with patient in craft room. Patient verbalized 5 coping skills for anger. LRT encouraged patient to use his coping skills when he felt himself getting angry to help calm himself down.  Intervention Used: Coping Skills worksheet  Leonette Monarch, LRT/CTRS 12.08.16 5:44 pm Goal: STG-Other Recreation Therapy Goal (Specify) STG: Stress Management - Within 4 treatment sessions, patient will verbalize understanding of the stress management techniques in each of 2 treatment sessions to increase stress management skills post d/c.  Outcome: Completed/Met Date Met:  06/28/15 Treatment Session 2; Completed 2 out of 2: At approximately 2:00 pm, LRT met with patient in craft room. Patient reported he read over the stress management techniques. Patient verbalized understanding. LRT encouraged patient to practice the stress management techniques. Intervention Used: Stress Management handouts  Leonette Monarch, LRT/CTRS 12.08.16 5:47 pm  Problem: [facility] Participation in Recreation Therapeutic Interventions Goal: STG-Patient will identify at least five coping skills for ** STG: Coping Skills - Within 4 treatment sessions, patient will verbalize at least 5 coping skills for substance abuse in each of 2 treatment sessions to decrease substance abuse post d/c.  Outcome: Completed/Met Date Met:  06/28/15 Treatment Session 2: Completed 2 out of 2: At approximately 2:00 pm, LRT met with patient in craft room. Patient verbalized 5 coping skills for substance abuse. LRT encouraged  patient to participate in leisure activities. Intervention Used: Coping Skills worksheet  Leonette Monarch, LRT/CTRS 12.08.16 5:48 pm

## 2015-06-28 NOTE — [facility] Group Notes (Signed)
[facility] LCSW Group Therapy  06/28/2015 11:16 AM (Late entry for 06/27/2015)   Type of Therapy:  Group Therapy  Participation Level:  Did Not Attend  Modes of Intervention:  Discussion, Education, Socialization and Support  Summary of Progress/Problems: Balance in life: Patients will discuss the concept of balance and how it looks and feels to be unbalanced. Pt will identify areas in their life that is unbalanced and ways to become more balanced.    Katya Rolston L Teyla Skidgel MSW, LCSWA  06/28/2015, 11:16 AM  

## 2015-06-28 NOTE — [facility] Group Notes (Signed)
[facility]  06/28/2015 3:38 PM  Type of Therapy:  Group Therapy  Participation Level:  Did Not Attend   Lulu Ridingngle, Daman Steffenhagen T, MSW, LCSWA 06/28/2015, 3:38 PM

## 2015-06-28 NOTE — [facility] Group Notes (Signed)
[facility] Group Notes:  (Nursing/MHT/Case Management/Adjunct)  Date:  06/28/2015  Time:  10:11 PM  Type of Therapy:  Evening Wrap-up Group  Participation Level:  Did Not Attend  Participation Quality:  N/A  Affect:  N/A  Cognitive:  N/A  Insight:  None  Engagement in Group:  N/A  Modes of Intervention:  Discussion  Summary of Progress/Problems:  Tomasita MorrowChelsea Nanta Jacorion Klem 06/28/2015, 10:11 PM

## 2015-06-28 NOTE — Progress Notes (Signed)
D: Pt denies SI/HI/AVH. Pt iscooperative and less irritable this morning. Pt reports does not want to go to [facility] for treatments. Reports he recognizes he needs help but wants outpatient therapy.  A: Pt was offered support and encouragement. Medications given as per MD order. Pt was encouraged  to attend groups. Q 15 minute checks were done for safety.   R:  Med and group compliant. Appropriate with staff and peers. Pt has no complaints at this time .Pt receptive to treatment and safety maintained on unit.

## 2015-06-28 NOTE — Tx Team (Signed)
Interdisciplinary Treatment Plan Update (Adult)  Date:  06/28/2015 Time Reviewed:  3:35 PM  Progress in Treatment: Attending groups: Yes. Participating in groups:  Yes. Taking medication as prescribed:  Yes. Tolerating medication:  Yes. Family/Significant othe contact made:  No, will contact:  patient's wife Mikle Bosworth [phone] Patient understands diagnosis:  Yes. Discussing patient identified problems/goals with staff:  Yes. Medical problems stabilized or resolved:  Yes. Denies suicidal/homicidal ideation: Yes. Issues/concerns per patient self-inventory:  Yes. Other:  New problem(s) identified: No, Describe:  none reported  Discharge Plan or Barriers: Patient will stabilize on medications and discharge home and will need follow up outpatient services scheduled in [facility]. Patient will need Medication Management application.   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  Estimated length of stay: up to 1 day with expected discharge 06/29/15  New goal(s):  Review of initial/current patient goals per problem list:   1.  Goal(s): Participate in care planning  Met:  No  Target date:by discahrge  2.  Goal (s): eliminate SI/HI  Met:  Yes  Target date:by discahrge  3.  Goal(s):decrease depression  Met:  No  Target date:by discahrge  Attendees: Physician:  Alethia Berthold, MD 12/8/20163:35 PM  Nursing:   Floyde Parkins, RN 12/8/20163:35 PM  Other:  Carmell Austria, LCSWA 12/8/20163:35 PM  Other:  Tilden Fossa, LCSWA 12/8/20163:35 PM  Other:  Marylou Flesher, LCSWA 12/8/20163:35 PM  Other:  12/8/20163:35 PM  Other:  12/8/20163:35 PM  Other:  12/8/20163:35 PM  Other:  12/8/20163:35 PM  Other:  12/8/20163:35 PM  Other:  12/8/20163:35  PM  Other:   12/8/20163:35 PM   Scribe for Treatment Team:   Keene Breath, MSW, LCSWA  [phone]  06/28/2015, 3:35 PM

## 2015-06-28 NOTE — [facility] Group Notes (Signed)
[facility] Group Notes:  (Nursing/MHT/Case Management/Adjunct)  Date:  06/28/2015  Time:  2:18 PM  Type of Therapy:  Psychoeducational Skills  Participation Level:  Active  Participation Quality:  Monopolizing and Redirectable  Affect:  Blunted and Flat  Cognitive:  Appropriate  Insight:  Improving  Engagement in Group:  Monopolizing and Off Topic  Modes of Intervention:  Activity and Discussion  Summary of Progress/Problems:  [name] 06/28/2015, 2:18 PM
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
## 2015-06-28 NOTE — Plan of Care (Signed)
Problem: Ineffective individual coping Goal: STG: Pt will be able to identify effective and ineffective STG: Pt will be able to identify effective and ineffective coping patterns  Outcome: Progressing Pt reports he needs help with aftercare visits

## 2015-06-29 MED ORDER — HYDROXYZINE HCL 50 MG PO TABS: 50.0000 mg | ORAL_TABLET | Freq: Four times a day (QID) | ORAL | Status: DC | PRN

## 2015-06-29 MED ORDER — TRAZODONE HCL 100 MG PO TABS: 100.0000 mg | ORAL_TABLET | Freq: Every day | ORAL | Status: DC

## 2015-06-29 MED ORDER — CARBAMAZEPINE 200 MG PO TABS: 200.0000 mg | ORAL_TABLET | Freq: Three times a day (TID) | ORAL | Status: DC

## 2015-06-29 MED ORDER — CARBAMAZEPINE 200 MG PO TABS
200.0000 mg | ORAL_TABLET | Freq: Three times a day (TID) | ORAL | Status: DC
Start: 2015-06-29 — End: 2015-06-29

## 2015-06-29 NOTE — Progress Notes (Signed)
Recreation Therapy Notes  INPATIENT RECREATION TR PLAN  Patient Details Name: [name] MRN: [id number] DOB: Dec 10, 1985 Today's Date: 06/29/2015  Rec Therapy Plan Is patient appropriate for Therapeutic Recreation?: Yes Treatment times per week: At least once a week TR Treatment/Interventions: 1:1 session, Group participation (Comment) (Appropriate participation in daily recreation therapy tx)  Discharge Criteria Pt will be discharged from therapy if:: Treatment goals are met, Discharged Treatment plan/goals/alternatives discussed and agreed upon by:: Patient/family  Discharge Summary Short term goals set: See Care Plan Short term goals met: Complete Progress toward goals comments: One-to-one attended Which groups?: Coping skills, Leisure education One-to-one attended: Stress managment, anger management, coping skills Reason goals not met: N/A Therapeutic equipment acquired: None Reason patient discharged from therapy: Discharge from hospital Pt/family agrees with progress & goals achieved: Yes Date patient discharged from therapy: 06/29/15   Leonette Monarch, LRT/CTRS 06/29/2015, 11:54 AM

## 2015-06-29 NOTE — [facility] Suicide Risk Assessment (Signed)
[facility] INPATIENT:  Family/Significant Other Suicide Prevention Education  Suicide Prevention Education:  Education Completed; [name] (wife) [phone] has been identified by the patient as the family member/significant other with whom the patient will be residing, and identified as the person(s) who will aid the patient in the event of a mental health crisis (suicidal ideations/suicide attempt).  With written consent from the patient, the family member/significant other has been provided the following suicide prevention education, prior to the and/or following the discharge of the patient.  The suicide prevention education provided includes the following:  Suicide risk factors  Suicide prevention and interventions  National Suicide Hotline telephone number  [facility] assessment telephone number  [facility] Emergency Assistance 911  [facility] and/or Residential Mobile Crisis Unit telephone number  Request made of family/significant other to:  Remove weapons (e.g., guns, rifles, knives), all items previously/currently identified as safety concern.    Remove drugs/medications (over-the-counter, prescriptions, illicit drugs), all items previously/currently identified as a safety concern.  The family member/significant other verbalizes understanding of the suicide prevention education information provided.  The family member/significant other agrees to remove the items of safety concern listed above.  [name], [name], MSW, LCSWA 06/29/2015, 11:16 AM
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
## 2015-06-29 NOTE — Progress Notes (Signed)
  [facility] Adult Case Management Discharge Plan :  Will you be returning to the same living situation after discharge:  No. Patient will be living with a friend and refused shelter info but is not able to return to his wife's home per his wife Ronne BinningJacquline Seriani At discharge, do you have transportation home?: Yes,  patient has a friend to pick up Do you have the ability to pay for your medications: No. Patient is [facility] to [facility] [phone]  Release of information consent forms completed and in the chart;  Patient's signature needed at discharge.  Patient to Follow up at: Follow-up Information    Follow up with [facility]. Go on 07/02/2015.   Why:  For follow-up care appt Monday 07/02/15 at 7:00am seeking medication managment and SAIOP   Contact information:   [address] [place], [place] Ph [phone] Fax [phone] Lorella NimrodHarvey [phone] Lorella NimrodHarvey        Next level of care provider [facility] Link:no  Patient denies SI/HI: Yes,  patient denies SI/HI    Safety Planning and Suicide Prevention discussed: Yes,  SPE discussed with patient and Ronne BinningJacquline Seriani (wife) [phone]  Have you used any form of tobacco in the last 30 days? (Cigarettes, Smokeless Tobacco, Cigars, and/or Pipes): Yes  Has patient been referred to the Quitline?: Patient refused referral  Lulu Ridingngle, Roshana Shuffield T, MSW, LCSWA 06/29/2015, 11:18 AM

## 2015-06-29 NOTE — [facility] Group Notes (Signed)
[facility] Group Notes:  (Nursing/MHT/Case Management/Adjunct)  Date:  06/29/2015  Time:  12:41 PM  Type of Therapy:  Psychoeducational Skills  Participation Level:  Did Not Attend   Lynelle SmokeCara [facility] 06/29/2015, 12:41 PM

## 2015-06-29 NOTE — [facility] Suicide Risk Assessment (Signed)
[facility] Discharge Suicide Risk Assessment   Demographic Factors:  Male, Caucasian and Unemployed  Total Time spent with patient: 35 minute  Musculoskeletal: Strength & Muscle Tone: within normal limits Gait & Station: normal Patient leans: N/A  Psychiatric Specialty Exam: Physical Exam  ROS  Blood pressure 121/73, pulse 60, temperature 98.2 F (36.8 C), temperature source Oral, resp. rate 20, height  (1.905 m), weight 94.348 kg (208 lb), SpO2 99 %.Body mass index is 26 kg/(m^2).  General Appearance: Casual  Eye Contact::  Good  Speech:  Clear and Coherent409  Volume:  Normal  Mood:  Euthymic  Affect:  Congruent  Thought Process:  Coherent  Orientation:  Full (Time, Place, and Person)  Thought Content:  Negative  Suicidal Thoughts:  No  Homicidal Thoughts:  No  Memory:  Immediate;   Good Recent;   Good Remote;   Good  Judgement:  Intact  Insight:  Present  Psychomotor Activity:  Normal  Concentration:  Good  Recall:  Good  Fund of Knowledge:Good  Language: Good  Akathisia:  No  Handed:  Right  AIMS (if indicated):     Assets:  Communication Skills Desire for Improvement Leisure Time Physical Health Social Support  Sleep:  Number of Hours: 6.15  Cognition: WNL  ADL's:  Intact   Have you used any form of tobacco in the last 30 days? (Cigarettes, Smokeless Tobacco, Cigars, and/or Pipes): Yes  Has this patient used any form of tobacco in the last 30 days? (Cigarettes, Smokeless Tobacco, Cigars, and/or Pipes) Yes, A prescription for an FDA-approved tobacco cessation medication was offered at discharge and the patient refused  Mental Status Per Nursing Assessment::   On Admission:  Self-harm thoughts, Self-harm behaviors  Current Mental Status by Physician: Patient denies any suicidal thoughts whatsoever currently. Affect is upbeat and calm. Thoughts are clear without any sign of psychosis.  Loss Factors: Decrease in vocational status and Loss of significant  relationship  Historical Factors: Impulsivity  Risk Reduction Factors:   Positive social support and Positive therapeutic relationship  Continued Clinical Symptoms:  Depression:   Comorbid alcohol abuse/dependence Alcohol/Substance Abuse/Dependencies  Cognitive Features That Contribute To Risk:  None    Suicide Risk:  Mild:  Suicidal ideation of limited frequency, intensity, duration, and specificity.  There are no identifiable plans, no associated intent, mild dysphoria and related symptoms, good self-control (both objective and subjective assessment), few other risk factors, and identifiable protective factors, including available and accessible social support.  Principal Problem: Bipolar 2 disorder [facility]) Discharge Diagnoses:  Patient Active Problem List   Diagnosis Date Noted  . Cocaine abuse, continuous use [F14.10] 06/26/2015  . Cocaine abuse with cocaine-induced mood disorder (HCC) [F14.14]   . Cocaine-induced mood disorder (HCC) [F14.94] 12/26/2014  . Bipolar 2 disorder (HCC) [F31.81] 12/26/2014  . Polysubstance dependence (HCC) [F19.20] 12/26/2014  . Fall [W19.XXXA] 12/22/2014    Follow-up Information    Follow up with [facility]. Go on 07/02/2015.   Why:  For follow-up care appt Monday 07/02/15 at 7:00am seeking medication managment and SAIOP   Contact information:   [address] [place], [place] Ph [phone] Fax [phone] Lorella Nimrod [phone] Lorella Nimrod        Plan Of Care/Follow-up recommendations:  Activity:  Increase activity as tolerated Diet:  Normal diet  Is patient on multiple antipsychotic therapies at discharge:  No   Has Patient had three or more failed trials of antipsychotic monotherapy by history:  No  Recommended Plan for Multiple Antipsychotic Therapies: NA  John Clapacs 06/29/2015, 10:25 AM

## 2015-06-29 NOTE — Progress Notes (Signed)
D: Pt denies SI/HI/AVH. Pt is irritable, anxious, pacing back and forth and restless. Patient appears  anxious and he is interacting with peers and staff appropriately.  A: Pt was offered support and encouragement. Pt was given scheduled medications. Pt was encouraged to attend groups. Q 15 minute checks were done for safety.  R:Pt attends groups and interacts well with peers and staff. Pt is taking medication. Pt receptive to treatment and safety maintained on unit.

## 2015-06-29 NOTE — Progress Notes (Signed)
Patient denies SI/HI, denies A/V hallucinations. Patient verbalizes understanding of discharge instructions, follow up care and prescriptions. Patient given all belongings from  locker. Patient escorted out by staff, transported by family. 

## 2015-06-30 IMAGING — CR RIGHT TIBIA AND FIBULA - 2 VIEW
1 series · 4 of 4 positions shown · non-contrast
Comparison: none

REASON FOR EXAM: laceration-- looking for foreign body
COMMENTS:

PROCEDURE:     DXR - DXR TIBIA AND FIBULA RT (LOWER L  - February 16, 2013 [DATE]
RESULT:     Images of the right tibia and fibula demonstrate no fracture,
dislocation or radiopaque foreign body.

[Series 1: ap · 0.17mm/px · 4 of 4 slices shown]
[im 1/4]
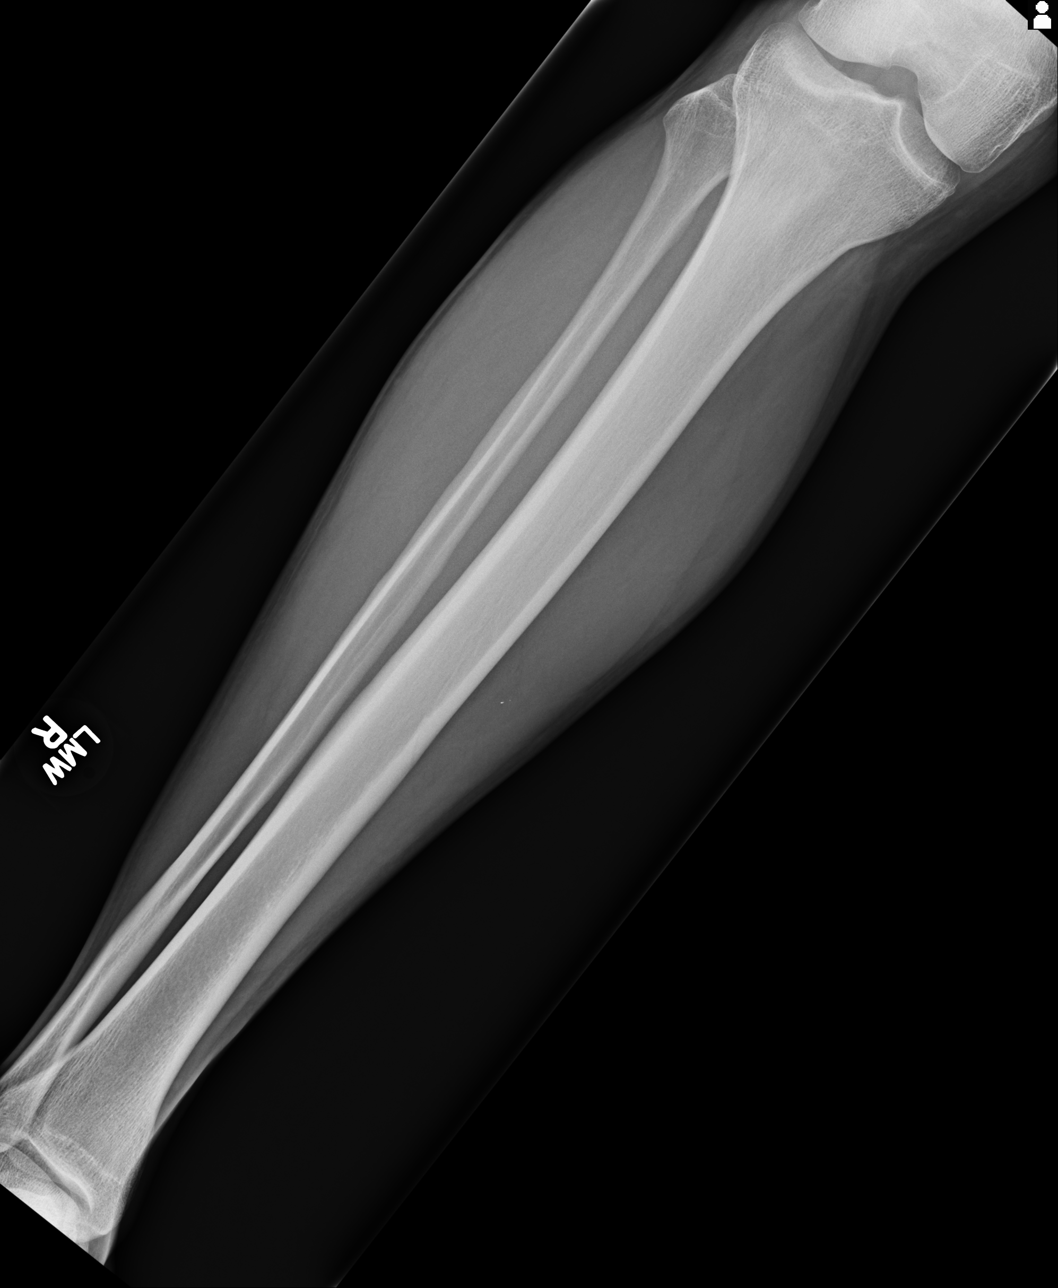
[im 2/4]
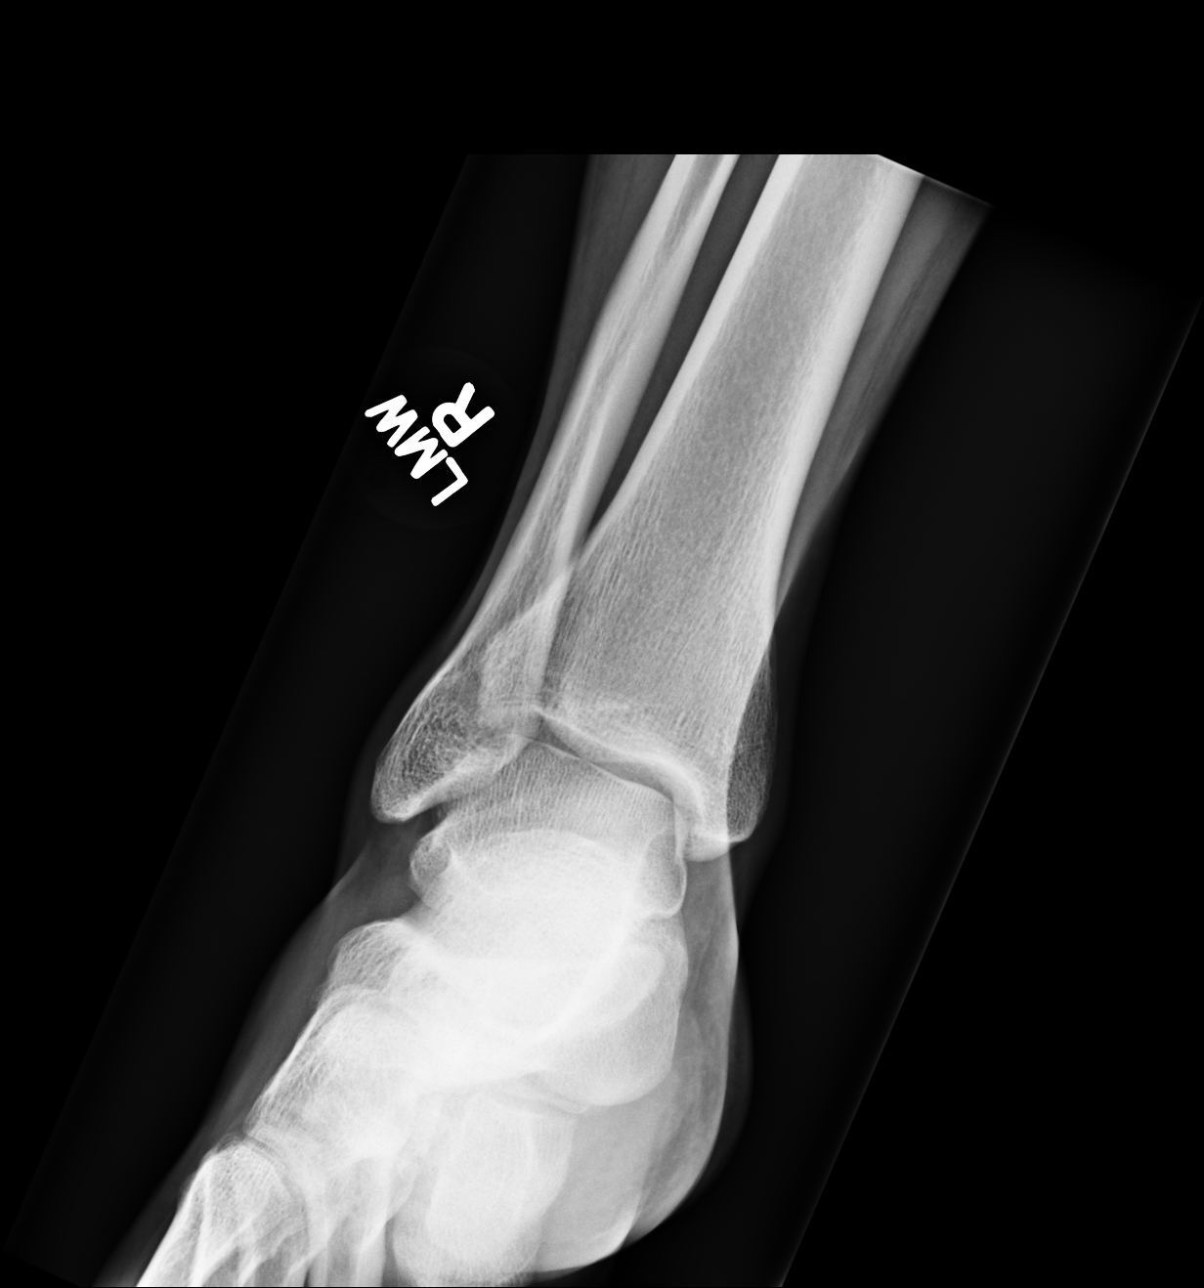
[im 3/4]
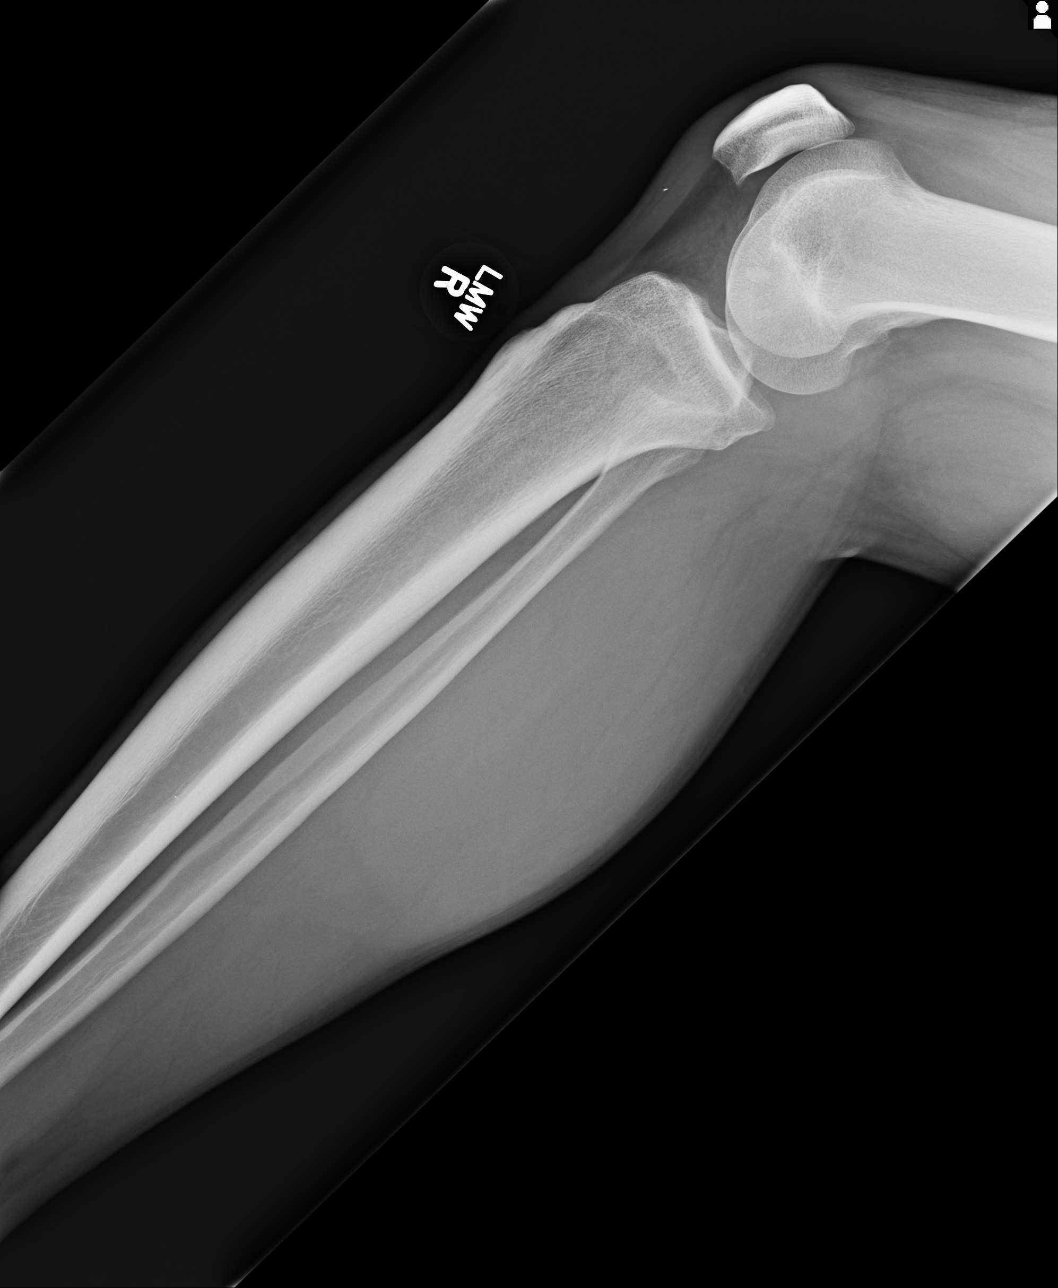
[im 4/4]
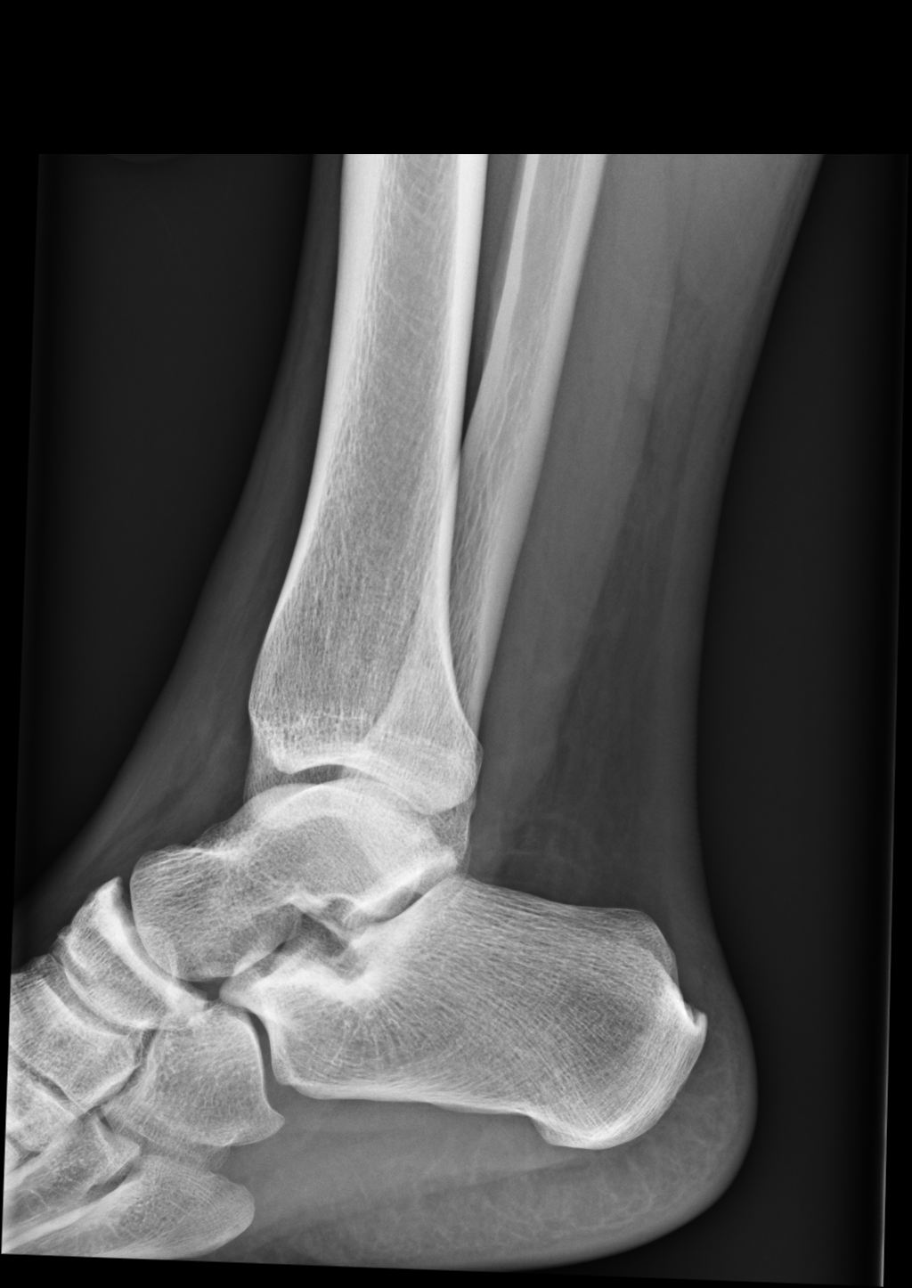

[4 of 4 positions shown; findings below may reference images not displayed]

IMPRESSION: Please see above.

[REDACTED]

## 2015-07-24 DIAGNOSIS — Z79899 Other long term (current) drug therapy: Secondary | ICD-10-CM | POA: Insufficient documentation

## 2015-07-24 DIAGNOSIS — F17210 Nicotine dependence, cigarettes, uncomplicated: Secondary | ICD-10-CM | POA: Insufficient documentation

## 2015-07-24 DIAGNOSIS — F1410 Cocaine abuse, uncomplicated: Secondary | ICD-10-CM | POA: Insufficient documentation

## 2015-07-24 NOTE — ED Notes (Addendum)
Pt arrived to ED with request to "help detox". Pt request detox from cocaine. Pt reports last use (snorts) of 20 dollars was yesterday. Pt denies SI/HI. Pt denies pain or discomfort at this time.

## 2015-07-25 ENCOUNTER — Emergency Department
Admission: EM | Admit: 2015-07-25 | Discharge: 2015-07-25 | Disposition: A | Discharge status: Home / Self Care | Payer: Self-pay | Attending: Emergency Medicine | Admitting: Emergency Medicine

## 2015-07-25 DIAGNOSIS — F1410 Cocaine abuse, uncomplicated: Secondary | ICD-10-CM

## 2015-07-25 NOTE — ED Notes (Signed)
Pt to ED seeking detox from cocaine. Pt denies SI and HI, any type of behavioral problems and any type of physical problems.

## 2015-07-25 NOTE — Discharge Instructions (Signed)
Stimulant Use Disorder-Cocaine  Cocaine is one of a group of powerful drugs called stimulants. Cocaine has medical uses for stopping nosebleeds and for pain control before minor nose or dental surgery. However, cocaine is misused because of the effects that it produces. These effects include:   · A feeling of extreme pleasure.  · Alertness.  · High energy.  Common street names for cocaine include coke, crack, blow, snow, and nose candy. Cocaine is snorted, dissolved in water and injected, or smoked.   Stimulants are addictive because they activate regions of the brain that produce both the pleasurable sensation of "reward" and psychological dependence. Together, these actions account for loss of control and the rapid development of drug dependence. This means you become ill without the drug (withdrawal) and need to keep using it to function.   Stimulant use disorder is use of stimulants that disrupts your daily life. It disrupts relationships with family and friends and how you do your job. Cocaine increases your blood pressure and heart rate. It can cause a heart attack or stroke. Cocaine can also cause death from irregular heart rate or seizures.  SYMPTOMS  Symptoms of stimulant use disorder with cocaine include:  · Use of cocaine in larger amounts or over a longer period of time than intended.  · Unsuccessful attempts to cut down or control cocaine use.  · A lot of time spent obtaining, using, or recovering from the effects of cocaine.  · A strong desire or urge to use cocaine (craving).  · Continued use of cocaine in spite of major problems at work, school, or home because of use.  · Continued use of cocaine in spite of relationship problems because of use.  · Giving up or cutting down on important life activities because of cocaine use.  · Use of cocaine over and over in situations when it is physically hazardous, such as driving a car.  · Continued use of cocaine in spite of a physical problem that is likely  related to use. Physical problems can include:  ¨ Malnutrition.  ¨ Nosebleeds.  ¨ Chest pain.  ¨ High blood pressure.  ¨ A hole that develops between the part of your nose that separates your nostrils (perforated nasal septum).  ¨ Lung and kidney damage.  · Continued use of cocaine in spite of a mental problem that is likely related to use. Mental problems can include:  ¨ Schizophrenia-like symptoms.  ¨ Depression.  ¨ Bipolar mood swings.  ¨ Anxiety.  ¨ Sleep problems.  · Need to use more and more cocaine to get the same effect, or lessened effect over time with use of the same amount of cocaine (tolerance).  · Having withdrawal symptoms when cocaine use is stopped, or using cocaine to reduce or avoid withdrawal symptoms. Withdrawal symptoms include:  ¨ Depressed or irritable mood.  ¨ Low energy or restlessness.  ¨ Bad dreams.  ¨ Poor or excessive sleep.  ¨ Increased appetite.  DIAGNOSIS  Stimulant use disorder is diagnosed by your health care provider. You may be asked questions about your cocaine use and how it affects your life. A physical exam may be done. A drug screen may be ordered. You may be referred to a mental health professional. The diagnosis of stimulant use disorder requires at least two symptoms within 12 months. The type of stimulant use disorder depends on the number of signs and symptoms you have. The type may be:  · Mild. Two or three signs and symptoms.  ·   Moderate. Four or five signs and symptoms.  · Severe. Six or more signs and symptoms.  TREATMENT  Treatment for stimulant use disorder is usually provided by mental health professionals with training in substance use disorders. The following options are available:  · Counseling or talk therapy. Talk therapy addresses the reasons you use cocaine and ways to keep you from using again. Goals of talk therapy include:  ¨ Identifying and avoiding triggers for use.  ¨ Handling cravings.  ¨ Replacing use with healthy activities.  · Support groups.  Support groups provide emotional support, advice, and guidance.  · Medicine. Certain medicines may decrease cocaine cravings or withdrawal symptoms.  HOME CARE INSTRUCTIONS  · Take medicines only as directed by your health care provider.  · Identify the people and activities that trigger your cocaine use and avoid them.  · Keep all follow-up visits as directed by your health care provider.  SEEK MEDICAL CARE IF:  · Your symptoms get worse or you relapse.  · You are not able to take medicines as directed.  SEEK IMMEDIATE MEDICAL CARE IF:  · You have serious thoughts about hurting yourself or others.  · You have a seizure, chest pain, sudden weakness, or loss of speech or vision.  FOR MORE INFORMATION  · National Institute on Drug Abuse: www.drugabuse.gov  · Substance Abuse and Mental Health Services Administration: www.samhsa.gov     This information is not intended to replace advice given to you by your health care provider. Make sure you discuss any questions you have with your health care provider.     Document Released: 07/04/2000 Document Revised: 07/28/2014 Document Reviewed: 07/20/2013  Elsevier Interactive Patient Education ©2016 Elsevier Inc.
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
## 2015-07-25 NOTE — BHH Counselor (Signed)
Pt has been accepted by [facility] and will be picked up at 3 am.

## 2016-08-03 ENCOUNTER — Emergency Department
Admission: EM | Admit: 2016-08-03 | Discharge: 2016-08-04 | Disposition: A | Discharge status: Other Acute Inpatient Hospital | Payer: Self-pay | Attending: Emergency Medicine | Admitting: Emergency Medicine

## 2016-08-03 ENCOUNTER — Encounter: Payer: Self-pay | Admitting: Emergency Medicine

## 2016-08-03 DIAGNOSIS — F17210 Nicotine dependence, cigarettes, uncomplicated: Secondary | ICD-10-CM | POA: Insufficient documentation

## 2016-08-03 DIAGNOSIS — R45851 Suicidal ideations: Secondary | ICD-10-CM | POA: Insufficient documentation

## 2016-08-03 DIAGNOSIS — F1910 Other psychoactive substance abuse, uncomplicated: Secondary | ICD-10-CM | POA: Insufficient documentation

## 2016-08-03 DIAGNOSIS — F1210 Cannabis abuse, uncomplicated: Secondary | ICD-10-CM | POA: Insufficient documentation

## 2016-08-03 DIAGNOSIS — Z5181 Encounter for therapeutic drug level monitoring: Secondary | ICD-10-CM | POA: Insufficient documentation

## 2016-08-03 DIAGNOSIS — F1410 Cocaine abuse, uncomplicated: Secondary | ICD-10-CM | POA: Insufficient documentation

## 2016-08-03 HISTORY — DX: Bipolar disorder, unspecified: F31.9

## 2016-08-03 LAB — URINALYSIS, COMPLETE (UACMP) WITH MICROSCOPIC
Bacteria, UA: NONE SEEN
Specific Gravity, Urine: 1.02 (ref 1.005–1.030)
Squamous Epithelial / LPF: NONE SEEN

## 2016-08-03 LAB — COMPREHENSIVE METABOLIC PANEL
ALBUMIN: 4.7 g/dL (ref 3.5–5.0)
ALT: 60 U/L (ref 17–63)
ANION GAP: 10 (ref 5–15)
AST: 45 U/L — ABNORMAL HIGH (ref 15–41)
Alkaline Phosphatase: 78 U/L (ref 38–126)
BILIRUBIN TOTAL: 0.7 mg/dL (ref 0.3–1.2)
BUN: 10 mg/dL (ref 6–20)
CHLORIDE: 102 mmol/L (ref 101–111)
CO2: 26 mmol/L (ref 22–32)
Calcium: 9.3 mg/dL (ref 8.9–10.3)
Creatinine, Ser: 0.81 mg/dL (ref 0.61–1.24)
GFR calc Af Amer: >60 mL/min (ref >60)
GLUCOSE: 96 mg/dL (ref 65–99)
POTASSIUM: 3.3 mmol/L — AB (ref 3.5–5.1)
Sodium: 138 mmol/L (ref 135–145)
TOTAL PROTEIN: 8.1 g/dL (ref 6.5–8.1)

## 2016-08-03 LAB — URINE DRUG SCREEN, QUALITATIVE (ARMC ONLY)
AMPHETAMINES, UR SCREEN: NOT DETECTED
BENZODIAZEPINE, UR SCRN: NOT DETECTED
Barbiturates, Ur Screen: NOT DETECTED
Cannabinoid 50 Ng, Ur ~~LOC~~: POSITIVE — AB
Cocaine Metabolite,Ur ~~LOC~~: POSITIVE — AB
MDMA (Ecstasy)Ur Screen: NOT DETECTED
METHADONE SCREEN, URINE: NOT DETECTED
Opiate, Ur Screen: NOT DETECTED
Phencyclidine (PCP) Ur S: NOT DETECTED
Tricyclic, Ur Screen: NOT DETECTED

## 2016-08-03 LAB — SALICYLATE LEVEL: Salicylate Lvl: <7 mg/dL (ref 2.8–30.0)

## 2016-08-03 LAB — CBC
HCT: 47.5 % (ref 40.0–52.0)
Hemoglobin: 16.6 g/dL (ref 13.0–18.0)
MCH: 30.3 pg (ref 26.0–34.0)
MCHC: 35.1 g/dL (ref 32.0–36.0)
MCV: 86.4 fL (ref 80.0–100.0)
Platelets: 250 10*3/uL (ref 150–440)
RBC: 5.49 MIL/uL (ref 4.40–5.90)
RDW: 13.1 % (ref 11.5–14.5)
WBC: 13.9 10*3/uL — AB (ref 3.8–10.6)

## 2016-08-03 LAB — ACETAMINOPHEN LEVEL

## 2016-08-03 LAB — ETHANOL

## 2016-08-03 MED ORDER — TRAZODONE HCL 100 MG PO TABS
ORAL_TABLET | ORAL | Status: AC
  Filled 2016-08-03: qty 1

## 2016-08-03 MED ORDER — IBUPROFEN 600 MG PO TABS
600.0000 mg | ORAL_TABLET | Freq: Once | ORAL | Status: DC
  Filled 2016-08-03: qty 1

## 2016-08-03 MED ORDER — TRAZODONE HCL 100 MG PO TABS
100.0000 mg | ORAL_TABLET | Freq: Every day | ORAL | Status: DC
  Administered 2016-08-03: 100 mg via ORAL
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
## 2016-08-03 NOTE — ED Notes (Signed)
Report called to Margaret, RN in ED B[facility] 

## 2016-08-03 NOTE — ED Notes (Signed)
[facility]

## 2016-08-03 NOTE — ED Notes (Signed)
Patient brought lunch 

## 2016-08-03 NOTE — ED Notes (Signed)
Called and informed SOC the report was issued under [name] and should have been [name], they said it would take up to 24 hours to correct issue. Dr. Shaune PollackLord aware and correcting written copy.

## 2016-08-03 NOTE — ED Notes (Signed)
Patient  Requested and received a snack.

## 2016-08-03 NOTE — ED Notes (Signed)

## 2016-08-03 NOTE — ED Notes (Signed)
Report was received from Dorise HissElizabeth C., RN; Pt. Verbalizes  Complaint of, "I have not eaten in (11) days"; distress ,"I just want to get some help";  Verbalizes having S.I./Hi verbalizes using cocaine; currently using $800.-1000.) a day; Continue to monitor with 15 min. Monitoring.

## 2016-08-03 NOTE — ED Notes (Signed)
Patient reports worsening depression with SI/HI . Pt reports that he's afraid that he will hurt somebody if he is discharged. States that he's "messed up in the head." Pt reports using cocaine to cope with his PTSD. Pt reports that he is ready to "get his life together". Patient encouraged to verbalize feelings. Safety maintained with 15 minute checks.

## 2016-08-03 NOTE — Progress Notes (Signed)
LCSW met with patient and informed him of community resources and RTSA. Patient was supported after he spoke to [facility], he made several statements that if he is not inpatient he is seriously homicidal and feels he will hurt someone. He kept repeating he is aware he needs help.   LCSW reviewed patient chart and called ED secretary to ensure patient is IVC'd.  [employer] LCSW [phone]

## 2016-08-03 NOTE — ED Notes (Signed)
Snack has been provided.

## 2016-08-03 NOTE — ED Notes (Signed)
Patient given new scrubs, toiletries and towels for shower.

## 2016-08-03 NOTE — ED Notes (Signed)
BEHAVIORAL HEALTH ROUNDING  Patient sleeping: No.  Patient alert and oriented: yes  Behavior appropriate: Yes. ; If no, describe:  Nutrition and fluids offered: Yes  Toileting and hygiene offered: Yes  Sitter present: not applicable, Q 15 min safety rounds and observation.  Law enforcement present: Yes ODS  

## 2016-08-03 NOTE — Progress Notes (Signed)
[facility] Dr. Toni Amendlapacs.  Attending Physician will be Dr Jennet MaduroPucilowska Patient has been [facility] room 307A, [facility] Charge Nurse Linton RumpJane Jones, RN.   Intake Paper Work has been signed and placed on patient chart. : PT IVC. ER staff is aware of the admission Christen Bame( Ronnie, ER Sect.; Dr. Shaune PollackLord, ER MD; Vikki PortsValerie Patient's Nurse &  Patient Access).  Daleen SquibbGreg Cariana Karge, LCSW

## 2016-08-03 NOTE — ED Notes (Signed)
Report was received by Dorise HissElizabeth C., RN; Pt. Verbalizes no complaints or distress;  Verbalizing having S.I./Hi.; with using cocaine(800.00-1000.00 a day); wife left during the Xmas holiday; sad and depressed;  Continue to monitor with 15 min. Monitoring.

## 2016-08-03 NOTE — ED Provider Notes (Addendum)
[facility] who recommends hospitalization for depression and suicidal ideation with drug abuse.     Addended to include, I reviewed the name on the faxed report which spell the name incorrectly, [facility] last name is Jimmey Ralpharker and his last name is [name].  [facility], will take 24 for official document change, has been requested to update the record.  I listed out the incorrect with the correct name.  I completed the involuntary commitment forms.   Governor Rooksebecca Zen Felling, MD 08/03/16 [id number]    Governor Rooksebecca Abdulwahab Demelo, MD 08/03/16 [id number]
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
## 2016-08-03 NOTE — ED Notes (Signed)
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
## 2016-08-03 NOTE — ED Notes (Signed)
Report was received from Amy H., RN; Pt. Verbalizes no complaints or distress; verbalizes having S.I./HI. Continue to monitor with 15 min. Monitoring.

## 2016-08-03 NOTE — ED Notes (Signed)
Called and informed SOC the report was issued under [facility] and should have been [facility], they said it would take up to 24 hours to correct issue. Dr. Lord aware and correcting written copy. 

## 2016-08-03 NOTE — ED Notes (Signed)
Offered patient shower. Patient declines, stating, "I'll get one in a little bit."
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
## 2016-08-03 NOTE — ED Notes (Signed)
Patient has received PM snack. 

## 2016-08-04 ENCOUNTER — Inpatient Hospital Stay
Admission: AD | Admit: 2016-08-04 | Discharge: 2016-08-08 | DRG: 885 | Disposition: A | Discharge status: Home / Self Care | Payer: No Typology Code available for payment source | Source: Ambulatory Visit | Attending: Psychiatry | Admitting: Psychiatry

## 2016-08-04 DIAGNOSIS — Z23 Encounter for immunization: Secondary | ICD-10-CM

## 2016-08-04 DIAGNOSIS — F1220 Cannabis dependence, uncomplicated: Secondary | ICD-10-CM | POA: Diagnosis present

## 2016-08-04 DIAGNOSIS — F17200 Nicotine dependence, unspecified, uncomplicated: Secondary | ICD-10-CM | POA: Diagnosis present

## 2016-08-04 DIAGNOSIS — F1420 Cocaine dependence, uncomplicated: Secondary | ICD-10-CM | POA: Diagnosis present

## 2016-08-04 DIAGNOSIS — R45850 Homicidal ideations: Secondary | ICD-10-CM | POA: Diagnosis present

## 2016-08-04 DIAGNOSIS — N319 Neuromuscular dysfunction of bladder, unspecified: Secondary | ICD-10-CM | POA: Diagnosis present

## 2016-08-04 DIAGNOSIS — N390 Urinary tract infection, site not specified: Secondary | ICD-10-CM | POA: Diagnosis not present

## 2016-08-04 DIAGNOSIS — Z87442 Personal history of urinary calculi: Secondary | ICD-10-CM | POA: Diagnosis not present

## 2016-08-04 DIAGNOSIS — F1994 Other psychoactive substance use, unspecified with psychoactive substance-induced mood disorder: Secondary | ICD-10-CM | POA: Diagnosis present

## 2016-08-04 DIAGNOSIS — Z818 Family history of other mental and behavioral disorders: Secondary | ICD-10-CM

## 2016-08-04 DIAGNOSIS — R45851 Suicidal ideations: Secondary | ICD-10-CM | POA: Diagnosis present

## 2016-08-04 DIAGNOSIS — F410 Panic disorder [episodic paroxysmal anxiety] without agoraphobia: Secondary | ICD-10-CM | POA: Diagnosis present

## 2016-08-04 DIAGNOSIS — F4310 Post-traumatic stress disorder, unspecified: Secondary | ICD-10-CM | POA: Diagnosis present

## 2016-08-04 DIAGNOSIS — Z888 Allergy status to other drugs, medicaments and biological substances status: Secondary | ICD-10-CM | POA: Diagnosis not present

## 2016-08-04 DIAGNOSIS — F3181 Bipolar II disorder: Secondary | ICD-10-CM | POA: Diagnosis present

## 2016-08-04 DIAGNOSIS — G4700 Insomnia, unspecified: Secondary | ICD-10-CM | POA: Diagnosis present

## 2016-08-04 DIAGNOSIS — R310 Gross hematuria: Secondary | ICD-10-CM | POA: Diagnosis present

## 2016-08-04 DIAGNOSIS — F4010 Social phobia, unspecified: Secondary | ICD-10-CM | POA: Diagnosis present

## 2016-08-04 MED ORDER — TRAZODONE HCL 100 MG PO TABS
100.0000 mg | ORAL_TABLET | Freq: Every evening | ORAL | Status: DC | PRN
  Administered 2016-08-04: 100 mg via ORAL
  Filled 2016-08-04: qty 1

## 2016-08-04 MED ORDER — MAGNESIUM HYDROXIDE 400 MG/5ML PO SUSP: 30.0000 mL | Freq: Every day | ORAL | Status: DC | PRN

## 2016-08-04 MED ORDER — ALUM & MAG HYDROXIDE-SIMETH 200-200-20 MG/5ML PO SUSP: 30.0000 mL | ORAL | Status: DC | PRN

## 2016-08-04 MED ORDER — INFLUENZA VAC SPLIT QUAD 0.5 ML IM SUSY
0.5000 mL | PREFILLED_SYRINGE | INTRAMUSCULAR | Status: AC
  Administered 2016-08-05: 0.5 mL via INTRAMUSCULAR
  Filled 2016-08-04: qty 0.5

## 2016-08-04 MED ORDER — HYDROXYZINE HCL 25 MG PO TABS
25.0000 mg | ORAL_TABLET | Freq: Three times a day (TID) | ORAL | Status: DC | PRN
  Administered 2016-08-05 – 2016-08-06 (×3): 25 mg via ORAL
  Filled 2016-08-04 (×3): qty 1

## 2016-08-04 MED ORDER — ACETAMINOPHEN 325 MG PO TABS
650.0000 mg | ORAL_TABLET | Freq: Four times a day (QID) | ORAL | Status: DC | PRN
  Administered 2016-08-06 – 2016-08-07 (×2): 650 mg via ORAL
  Filled 2016-08-04 (×2): qty 2

## 2016-08-04 NOTE — ED Notes (Signed)
Pt has spoken to wife on phone. Pt appeared agitated while speaking to her, but was calm and cooperative when returning the phone to nurses station. Pt anxious to move to BMU. "I know I can eat more down there."  Pt watching TV in his room. No concerns voiced. Maintained on 15 minute checks and observation by security camera for safety.

## 2016-08-04 NOTE — [facility] Group Notes (Signed)
[facility] Group Notes:  (Nursing/MHT/Case Management/Adjunct)  Date:  08/04/2016  Time:  5:25 PM  Type of Therapy:  Psychoeducational Skills  Participation Level:  Minimal  Participation Quality:  Attentive and Monopolizing  Affect:  Blunted  Cognitive:  Appropriate  Insight:  Appropriate  Engagement in Group:  Engaged and Supportive  Modes of Intervention:  Discussion and Education  Summary of Progress/Problems:  [name] 08/04/2016, 5:25 PM

## 2016-08-04 NOTE — ED Provider Notes (Signed)
-----------------------------------------   6:54 AM on 08/04/2016 -----------------------------------------   Blood pressure 119/72, pulse 95, temperature 98 F (36.7 C), temperature source Oral, resp. rate 18, height 6\' 2"  (1.88 m), weight 280 lb (127 kg), SpO2 98 %.  The patient had no acute events since last update.  Calm and cooperative at this time.  The patient is supposed to be admitted to the psychiatric inpatient unit this morning.     Rebecka ApleyAllison P Webster, MD 08/04/16 [phone]

## 2016-08-04 NOTE — ED Notes (Signed)
Pt told he will be transferred to BMU. Pt accepting. Report called to TickfawGigi, Charity fundraiserN.

## 2016-08-04 NOTE — Tx Team (Signed)
Initial Treatment Plan 08/04/2016 2:08 PM [name] ZOX:[id number]    PATIENT STRESSORS: Financial difficulties Medication change or noncompliance Substance abuse Traumatic event   PATIENT STRENGTHS: Ability for insight Capable of independent living Communication skills Motivation for treatment/growth Supportive family/friends Work skills   PATIENT IDENTIFIED PROBLEMS: Suicidal and homicidal ideations 08/04/2016  Depression 08/04/2016  PTSD 08/04/2016                 DISCHARGE CRITERIA:  Ability to meet basic life and health needs Adequate post-discharge living arrangements Motivation to continue treatment in a less acute level of care Verbal commitment to aftercare and medication compliance  PRELIMINARY DISCHARGE PLAN: Attend aftercare/continuing care group Return to previous living arrangement Return to previous work or school arrangements  PATIENT/FAMILY INVOLVEMENT: This treatment plan has been presented to and reviewed with the patient, [name], and/or family member, .  The patient and family have been given the opportunity to ask questions and make suggestions.  [name], RN 08/04/2016, 2:08 PM

## 2016-08-04 NOTE — ED Notes (Signed)
Pt ate 100% of breakfast.

## 2016-08-04 NOTE — Progress Notes (Signed)
Patient is cooperative during admission assessment. Patient denies SI/HI at this time. Patient denies AVH. Patient informed of fall risk status, fall risk assessed "low" at this time. Patient oriented to unit/staff/room.Patient stated that he need to get treatment for his mental illness,he can take care of his substance abuse sine he got "good will power". Patient denies any questions/concerns at this time. Patient safe on unit with Q15 minute checks for safety. Skin assessment & body search done,no contraband found.

## 2016-08-04 NOTE — ED Notes (Signed)
Pt discharged to BMU. Escorted by BPD and RN (IVC).  Pt accepting. Belongings will be sent with pt.

## 2016-08-05 MED ORDER — TRAZODONE HCL 50 MG PO TABS: 50.0000 mg | ORAL_TABLET | Freq: Every evening | ORAL | Status: DC | PRN

## 2016-08-05 MED ORDER — ZOLPIDEM TARTRATE 5 MG PO TABS
10.0000 mg | ORAL_TABLET | Freq: Every day | ORAL | Status: DC
  Administered 2016-08-05 – 2016-08-07 (×3): 10 mg via ORAL
  Filled 2016-08-05 (×3): qty 2

## 2016-08-05 MED ORDER — DIVALPROEX SODIUM 500 MG PO DR TAB
500.0000 mg | DELAYED_RELEASE_TABLET | Freq: Three times a day (TID) | ORAL | Status: DC
Start: 2016-08-05 — End: 2016-08-08
  Administered 2016-08-05 – 2016-08-08 (×9): 500 mg via ORAL
  Filled 2016-08-05 (×9): qty 1

## 2016-08-05 MED ORDER — QUETIAPINE FUMARATE 25 MG PO TABS: 25.0000 mg | ORAL_TABLET | Freq: Every day | ORAL | Status: DC

## 2016-08-05 MED ORDER — PRAZOSIN HCL 2 MG PO CAPS
2.0000 mg | ORAL_CAPSULE | Freq: Two times a day (BID) | ORAL | Status: DC
  Administered 2016-08-05 – 2016-08-08 (×7): 2 mg via ORAL
  Filled 2016-08-05 (×7): qty 1
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
## 2016-08-05 NOTE — [facility] Suicide Risk Assessment (Signed)
[facility]   Nursing information obtained from:    Demographic factors:    Current Mental Status:    Loss Factors:    Historical Factors:    Risk Reduction Factors:     Total Time spent with patient: 1 hour Principal Problem: Bipolar 2 disorder (HCC) Diagnosis:   Patient Active Problem List   Diagnosis Date Noted  . Substance induced mood disorder (HCC) [F19.94] 08/04/2016  . Cannabis use disorder, moderate, dependence (HCC) [F12.20] 08/04/2016  . Tobacco use disorder [F17.200] 08/04/2016  . Cocaine use disorder, moderate, dependence (HCC) [F14.20] 06/26/2015  . Cocaine abuse with cocaine-induced mood disorder (HCC) [F14.14]   . Cocaine-induced mood disorder (HCC) [F14.94] 12/26/2014  . Bipolar 2 disorder (HCC) [F31.81] 12/26/2014  . Polysubstance dependence (HCC) [F19.20] 12/26/2014  . Fall [W19.XXXA] 12/22/2014   Subjective Data: suicide attempt.  Continued Clinical Symptoms:  Alcohol Use Disorder Identification Test Final Score (AUDIT): 1 The "Alcohol Use Disorders Identification Test", Guidelines for Use in Primary Care, Second Edition.  World Science writerHealth Organization [facility](WHO). Score between 0-7:  no or low risk or alcohol related problems. Score between 8-15:  moderate risk of alcohol related problems. Score between 16-19:  high risk of alcohol related problems. Score 20 or above:  warrants further diagnostic evaluation for alcohol dependence and treatment.   CLINICAL FACTORS:   Bipolar Disorder:   Mixed State Depression:   Comorbid alcohol abuse/dependence Impulsivity Insomnia Alcohol/Substance Abuse/Dependencies   Musculoskeletal: Strength & Muscle Tone: within normal limits Gait & Station: normal Patient leans: N/A  Psychiatric Specialty Exam: Physical Exam  Nursing note and vitals reviewed.   Review of Systems  Psychiatric/Behavioral: Positive for depression, substance abuse and suicidal ideas. The patient is nervous/anxious and has  insomnia.   All other systems reviewed and are negative.   Blood pressure 127/64, pulse 78, temperature 97.8 F (36.6 C), temperature source Oral, resp. rate 20, height 6\' 2"  (1.88 m), weight 102.5 kg (226 lb), SpO2 99 %.Body mass index is 29.02 kg/m.  General Appearance: Casual  Eye Contact:  Good  Speech:  Pressured  Volume:  Increased  Mood:  Dysphoric  Affect:  Labile  Thought Process:  Goal Directed and Descriptions of Associations: Intact  Orientation:  Full (Time, Place, and Person)  Thought Content:  WDL  Suicidal Thoughts:  Yes.  with intent/plan  Homicidal Thoughts:  Yes.  with intent/plan  Memory:  Immediate;   Fair Recent;   Fair Remote;   Fair  Judgement:  Poor  Insight:  Lacking  Psychomotor Activity:  Increased  Concentration:  Concentration: Fair and Attention Span: Fair  Recall:  Fis[employer]Fair  Fund of Knowledge:  Fair  Language:  Fair  Akathisia:  No  Handed:  Right  AIMS (if indicated):     Assets:  Communication Skills Desire for Improvement Physical Health Resilience Social Support  ADL's:  Intact  Cognition:  WNL  Sleep:  Number of Hours: 6.3      COGNITIVE FEATURES THAT CONTRIBUTE TO RISK:  None    SUICIDE RISK:   Moderate:  Frequent suicidal ideation with limited intensity, and duration, some specificity in terms of plans, no associated intent, good self-control, limited dysphoria/symptomatology, some risk factors present, and identifiable protective factors, including available and accessible social support.   PLAN OF CARE: Hospital admission, medication management, and substance abuse counseling, discharge planning.  [name] is a 31 year old male with a history of bipolar disorder and substance admitted after suicide attempt with a gun in  the context of relapse on substances, treatment noncompliance, and marital conflict.  1. Suicidal and homicidal ideation. The patient is able to contract for safety in the hospital.  2. Mood. We will start  Depakote for mood stabilization and low dose Seroquel at night to facilitate sleep.  3. Anxiety. The patient reports nightmares and flashbacks of PTSD. We will start Minipress.  4. Substance abuse treatment. The patient minimizes substance abuse problems and puts forward mental illness as a cause of his trouble. He is marginally interested in substance abuse treatment.  5. Disposition. He will be discharged to home. The patient requests family meeting. He will follow up with [facility].  I certify that inpatient services furnished can reasonably be expected to improve the patient's condition.  Kristine Linea, MD 08/05/2016, 12:11 PM

## 2016-08-05 NOTE — Plan of Care (Signed)
Problem: Activity: Goal: Sleeping patterns will improve Outcome: Progressing Patient slept for Estimated Hours of 6.30; safety maintained, no injury or falls during this shift.   

## 2016-08-05 NOTE — Progress Notes (Signed)
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 but feels he should be)  Coping Skills:   Isolate, Arguments, Substance Abuse, Avoidance, Exercise, Talking, Music, Other (Comment) (Work)  Personal Challenges: Anger, Communication, Decision-Making, Expressing Yourself, Problem-Solving, Relationships, Self-Esteem/Confidence, Social Interaction, Stress Management, Substance Abuse, Trusting Others  Leisure Interests (2+):  Individual - Other (Comment) Equities trader(Mechanic work, 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)  Awareness of Community Resources:  Yes  Community Resources:  [employer], [place]  Current Use: No  If no, Barriers?: Social  Patient Strengths:  Personality, ability and skills to work with his hands  Patient Identified Areas of Improvement:  Mental issues, communication with wife - being honest  Current Recreation Participation:  Working  Patient Goal for Hospitalization:  To make the best out of the treatment offered and be transferred to a long-term place  [place]  [place]idence:  [place]   Current SI (including self-harm):  Yes  Current HI:  Yes  Consent to Intern Participation: N/A   Jacquelynn CreeGreene,Hiromi Knodel M, LRT/CTRS 08/05/2016, 4:34 PM
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
## 2016-08-05 NOTE — Progress Notes (Signed)
Recreation Therapy Notes  Date: 01.16.18 Time: 3:00 pm Location: Craft Room  Group Topic: Self-expression  Goal Area(s) Addresses:  Patient will be able to identify a color that represents each emotion. Patient will verbalize benefit of using art as a means of self-expression. Patient will verbalize one emotion experienced while participating in activity.  Behavioral Response: Attentive, Interactive  Intervention: The Colors Within Me  Activity: Patients were given a blank face worksheet and were instructed to pick a color for each emotion and show on the worksheet how much of that emotion they were feeling.  Education: LRT educated patients on different forms of self-expression.  Education Outcome: Acknowledges education/In group clarification offered  Clinical Observations/Feedback: Patient picked a color for each emotion he was feeling and showed on the worksheet how much of that emotion he was feeling. Patient contributed to group discussion by stating what emotions he was feeling, how his emotions affect his treatment in the hospital, and how he sees his emotions changing once he is d/c.  Jacquelynn CreeGreene,Steffon Gladu M, LRT/CTRS 08/05/2016 4:22 PM

## 2016-08-05 NOTE — Progress Notes (Signed)
Patient with appropriate affect, cooperative behavior with meals, meds and plan of care. No SI at this time, reports he is here rt chronic "HI". Patient states he does not feel substances are an issue at this time. Social with peers, appropriate with staff. Therapy encouraged, safety maintained.
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
## 2016-08-05 NOTE — Tx Team (Signed)
Interdisciplinary Treatment and Diagnostic Plan Update  08/05/2016 Time of Session: [address] [name] MRN: [id number]  Principal Diagnosis: Bipolar 2 disorder [facility](HCC)  Secondary Diagnoses: Principal Problem:   Bipolar 2 disorder (HCC) Active Problems:   Cocaine use disorder, moderate, dependence (HCC)   Substance induced mood disorder (HCC)   Cannabis use disorder, moderate, dependence (HCC)   Tobacco use disorder   Current Medications:  Current Facility-Administered Medications  Medication Dose Route Frequency Provider Last Rate Last Dose  . acetaminophen (TYLENOL) tablet 650 mg  650 mg Oral Q6H PRN Audery AmelJohn T Clapacs, MD      . alum & mag hydroxide-simeth (MAALOX/MYLANTA) 200-200-20 MG/5ML suspension 30 mL  30 mL Oral Q4H PRN Audery AmelJohn T Clapacs, MD      . hydrOXYzine (ATARAX/VISTARIL) tablet 25 mg  25 mg Oral TID PRN Audery AmelJohn T Clapacs, MD      . Influenza vac split quadrivalent PF (FLUARIX) injection 0.5 mL  0.5 mL Intramuscular Tomorrow-1000 Jolanta B Pucilowska, MD      . magnesium hydroxide (MILK OF MAGNESIA) suspension 30 mL  30 mL Oral Daily PRN Audery AmelJohn T Clapacs, MD      . traZODone (DESYREL) tablet 100 mg  100 mg Oral QHS PRN Audery AmelJohn T Clapacs, MD   100 mg at 08/04/16 2129   PTA Medications: Prescriptions Prior to Admission  Medication Sig Dispense Refill Last Dose  . carbamazepine (TEGRETOL) 200 MG tablet Take 1 tablet (200 mg total) by mouth 3 (three) times daily. (Patient not taking: Reported on 08/03/2016) 90 tablet 0 Not Taking at Unknown time  . hydrOXYzine (ATARAX/VISTARIL) 50 MG tablet Take 1 tablet (50 mg total) by mouth every 6 (six) hours as needed for itching or anxiety. (Patient not taking: Reported on 08/03/2016) 60 tablet 0 Not Taking at Unknown time    Patient Stressors: Financial difficulties Medication change or noncompliance Substance abuse Traumatic event  Patient Strengths: Ability for insight Capable of independent living Communication skills Motivation for  treatment/growth Supportive family/friends Work skills  Treatment Modalities: Medication Management, Group therapy, Case management,  1 to 1 session with clinician, Psychoeducation, Recreational therapy.   Physician Treatment Plan for Primary Diagnosis: Bipolar 2 disorder (HCC) Long Term Goal(s):     Short Term Goals:    Medication Management: Evaluate patient's response, side effects, and tolerance of medication regimen.  Therapeutic Interventions: 1 to 1 sessions, Unit Group sessions and Medication administration.  Evaluation of Outcomes: Progressing  Physician Treatment Plan for Secondary Diagnosis: Principal Problem:   Bipolar 2 disorder (HCC) Active Problems:   Cocaine use disorder, moderate, dependence (HCC)   Substance induced mood disorder (HCC)   Cannabis use disorder, moderate, dependence (HCC)   Tobacco use disorder  Long Term Goal(s):     Short Term Goals:       Medication Management: Evaluate patient's response, side effects, and tolerance of medication regimen.  Therapeutic Interventions: 1 to 1 sessions, Unit Group sessions and Medication administration.  Evaluation of Outcomes: Progressing   RN Treatment Plan for Primary Diagnosis: Bipolar 2 disorder (HCC) Long Term Goal(s): Knowledge of disease and therapeutic regimen to maintain health will improve  Short Term Goals: Ability to verbalize frustration and anger appropriately will improve, Ability to demonstrate self-control, Ability to disclose and discuss suicidal ideas, Ability to identify and develop effective coping behaviors will improve and Compliance with prescribed medications will improve  Medication Management: RN will administer medications as ordered by provider, will assess and evaluate patient's response and provide education to patient  for prescribed medication. RN will report any adverse and/or side effects to prescribing provider.  Therapeutic Interventions: 1 on 1 counseling sessions,  Psychoeducation, Medication administration, Evaluate responses to treatment, Monitor vital signs and CBGs as ordered, Perform/monitor CIWA, COWS, AIMS and Fall Risk screenings as ordered, Perform wound care treatments as ordered.  Evaluation of Outcomes: Progressing   LCSW Treatment Plan for Primary Diagnosis: Bipolar 2 disorder (HCC) Long Term Goal(s): Safe transition to appropriate next level of care at discharge, Engage patient in therapeutic group addressing interpersonal concerns.  Short Term Goals: Engage patient in aftercare planning with referrals and resources, Increase social support, Increase emotional regulation, Facilitate patient progression through stages of change regarding substance use diagnoses and concerns, Identify triggers associated with mental health/substance abuse issues and Increase skills for wellness and recovery  Therapeutic Interventions: Assess for all discharge needs, 1 to 1 time with Social worker, Explore available resources and support systems, Assess for adequacy in community support network, Educate family and significant other(s) on suicide prevention, Complete Psychosocial Assessment, Interpersonal group therapy.  Evaluation of Outcomes: Progressing   Progress in Treatment: Attending groups: Yes. Participating in groups: Yes. Taking medication as prescribed: Yes. Toleration medication: Yes. Family/Significant other contact made: No, will contact:  wife Patient understands diagnosis: Yes. Discussing patient identified problems/goals with staff: Yes. Medical problems stabilized or resolved: Yes. Denies suicidal/homicidal ideation: Yes. Issues/concerns per patient self-inventory: Yes. Other: none  New problem(s) identified: No, Describe:  none  New Short Term/Long Term Goal(s):none   Discharge Plan or Barriers: CSW assessing for appropriate discharge plan  Reason for Continuation of Hospitalization: Aggression Depression Mania  Estimated  Length of Stay: 3-5 days  Attendees: Patient: [name] 08/05/2016   Physician: Dr Jennet Maduro, MD 08/05/2016   Nursing: Elenore Paddy, RN 08/05/2016   RN Care Manager: 08/05/2016   Social Worker: Daleen Squibb, LCSW 08/05/2016   Recreational Therapist:  08/05/2016   Other:  08/05/2016   Other:  08/05/2016   Other: 08/05/2016        Scribe for Treatment Team: Lorri Frederick, LCSW 08/05/2016 11:53 AM

## 2016-08-05 NOTE — [facility] Group Notes (Signed)
[facility] Group Notes:  (Nursing/MHT/Case Management/Adjunct)  Date:  08/05/2016  Time:  9:42 PM  Type of Therapy:  Psychoeducational Skills  Participation Level:  Active  Participation Quality:  Intrusive  Affect:  Anxious  Cognitive:  Delusional  Insight:  None  Engagement in Group:  Distracting  Modes of Intervention:  Confrontation  Summary of Progress/Problems:  [name] 08/05/2016, 9:42 PM

## 2016-08-06 LAB — URINALYSIS, COMPLETE (UACMP) WITH MICROSCOPIC
BACTERIA UA: NONE SEEN
SPECIFIC GRAVITY, URINE: 1.027 (ref 1.005–1.030)

## 2016-08-06 LAB — LIPID PANEL
Cholesterol: 182 mg/dL (ref 0–200)
HDL: 39 mg/dL — AB (ref >40)
LDL CALC: 94 mg/dL (ref 0–99)
TRIGLYCERIDES: 246 mg/dL — AB (ref <150)
Total CHOL/HDL Ratio: 4.7 RATIO
VLDL: 49 mg/dL — ABNORMAL HIGH (ref 0–40)

## 2016-08-06 LAB — TSH: TSH: 3.362 u[IU]/mL (ref 0.350–4.500)

## 2016-08-06 MED ORDER — RISPERIDONE 1 MG PO TABS
2.0000 mg | ORAL_TABLET | Freq: Every day | ORAL | Status: DC
  Administered 2016-08-06 – 2016-08-07 (×2): 2 mg via ORAL
  Filled 2016-08-06 (×2): qty 2

## 2016-08-06 NOTE — Plan of Care (Signed)
Problem: Coping: Goal: Ability to cope will improve Outcome: Progressing Pt continues to be loud, intrusive, irritable. Reports feelings of staff doing things intentional toward him. Does voice ways to cope and change his thoughts.

## 2016-08-06 NOTE — Progress Notes (Signed)
Patient remains loud,  irritable and angry. Vistaril 25 mg po given for anxiety PRN at 2147 and 0635. Pt tol po AM scheduled medication well. Refused AM vital signs. C/o blood in urine. Reports h/o kidney stones. No s/s of acute distress noted. Will continue to monitor safety and behavior.

## 2016-08-06 NOTE — Progress Notes (Signed)
Patient c/o bloody in urine. Hematuria noted. Pt refused tylenol for pain. No s/s of acute distress noted. Will continue to monitor for safety and behavior.

## 2016-08-06 NOTE — [facility] Group Notes (Signed)
[facility] Group Notes:  (Nursing/MHT/Case Management/Adjunct)  Date:  08/06/2016  Time:  5:11 PM  Type of Therapy:  Psychoeducational Skills  Participation Level:  Minimal  Participation Quality:  Attentive  Affect:  Flat  Cognitive:  Appropriate  Insight:  Improving  Engagement in Group:  Engaged  Modes of Intervention:  Discussion and Education  Summary of Progress/Problems:  [name] 08/06/2016, 5:11 PM

## 2016-08-06 NOTE — Progress Notes (Signed)
Pt awake, alert, oriented and up on unit today. Appears anxious, intense. Pt is loud, assertive. Reports fair sleep last night with sleep medication, good appetite, hyper energy, "ok" concentration. Rates depression 5/10, anxiety 1-2, hopelessness 3/10 (low 0-10 high). Pt voices homicidal thoughts, but is able to voice coping skills and verbalizes his ways of redirecting his thoughts. Denied thoughts of suicide this morning. Denies AVH. Continues to be loud, angry and irritable especially when he thought staff intentionally made a peer move to another room "because she too close to me" although the pt was informed of reason for room change being heating issues in pt's room. Stated goal today is "getting feelings under control" by "talk it out."   Support and encouragement provided. Medications administered as ordered with education. Safety maintained with every 15 minute checks. Will continue to monitor.

## 2016-08-06 NOTE — Progress Notes (Signed)
Patient remains loud and intrusive. Urine specimen obtained and sent to lab. Urine color bright red. Pt states "its very painful". Tylenol 650 mg offered but refused. Vistaril 50 mg po given for anxiety PRN at 2124. Will continue to monitor for safety and behavior.

## 2016-08-06 NOTE — Plan of Care (Signed)
Problem: Coping: Goal: Ability to verbalize frustrations and anger appropriately will improve Outcome: Not Met (add Reason) Pt remains loud and angry with HI. Pressured rapid speech. Slept 6 hours. Safety maintained. Pt remain free from harm.
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
## 2016-08-07 ENCOUNTER — Inpatient Hospital Stay: Payer: No Typology Code available for payment source

## 2016-08-07 DIAGNOSIS — R310 Gross hematuria: Secondary | ICD-10-CM

## 2016-08-07 DIAGNOSIS — F3181 Bipolar II disorder: Principal | ICD-10-CM

## 2016-08-07 MED ORDER — DOCUSATE SODIUM 100 MG PO CAPS: 100.0000 mg | ORAL_CAPSULE | Freq: Two times a day (BID) | ORAL | Status: DC

## 2016-08-07 MED ORDER — POLYETHYLENE GLYCOL 3350 17 G PO PACK: 17.0000 g | PACK | Freq: Every day | ORAL | Status: DC

## 2016-08-07 MED ORDER — MAGNESIUM CITRATE PO SOLN
1.0000 | Freq: Once | ORAL | Status: DC
  Filled 2016-08-07: qty 296

## 2016-08-07 MED ORDER — ONDANSETRON HCL 4 MG PO TABS
4.0000 mg | ORAL_TABLET | Freq: Three times a day (TID) | ORAL | Status: DC | PRN
  Administered 2016-08-07 – 2016-08-08 (×2): 4 mg via ORAL
  Filled 2016-08-07 (×2): qty 1

## 2016-08-07 MED ORDER — IBUPROFEN 800 MG PO TABS
800.0000 mg | ORAL_TABLET | Freq: Four times a day (QID) | ORAL | Status: DC | PRN
  Administered 2016-08-07: 800 mg via ORAL
  Filled 2016-08-07: qty 1

## 2016-08-07 NOTE — [facility] Group Notes (Signed)
[facility] LCSW Group Therapy Note  Date/Time: 08/07/16, 0900  Type of Therapy/Topic:  Group Therapy:  Balance in Life  Participation Level:  active  Description of Group:    This group will address the concept of balance and how it feels and looks when one is unbalanced. Patients will be encouraged to process areas in their lives that are out of balance, and identify reasons for remaining unbalanced. Facilitators will guide patients utilizing problem- solving interventions to address and correct the stressor making their life unbalanced. Understanding and applying boundaries will be explored and addressed for obtaining  and maintaining a balanced life. Patients will be encouraged to explore ways to assertively make their unbalanced needs known to significant others in their lives, using other group members and facilitator for support and feedback.  Therapeutic Goals: 1. Patient will identify two or more emotions or situations they have that consume much of in their lives. 2. Patient will identify signs/triggers that life has become out of balance:  3. Patient will identify two ways to set boundaries in order to achieve balance in their lives:  4. Patient will demonstrate ability to communicate their needs through discussion and/or role plays  Summary of Patient Progress: Pt arrived late and was involved in side conversations with another pt.  Pt did share that he works 7 days per week and that this has assisted his recovery from substances as he has little time to think about using drugs.  He acknowledged that working so much has affected other areas of his life, like his marriage.   Therapeutic Modalities:   Cognitive Behavioral Therapy Solution-Focused Therapy Assertiveness Training  Daleen SquibbGreg Sanjuan Sawa, [place]

## 2016-08-07 NOTE — Plan of Care (Signed)
[facility] Goal: STG-Patient will demonstrate improved self esteem by identif STG: Self-Esteem - Within 4 treatment sessions, patient will verbalize at least 5 positive affirmation statements in each of 2 treatment sessions to increase self-esteem post d/c.  Outcome: Not Progressing Treatment Session 1; Completed 0 out of 2: At approximately 2:40 pm, LRT attempted treatment session. Patient reported he was not feeling well and requested for LRT to come back tomorrow.  Jacquelynn CreeElizabeth M Jereld Presti, LRT/CTRS 01.18.18 2:58 pm Goal: STG-Other Recreation Therapy Goal (Specify) STG: Stress Management - Within 4 treatment sessions, patient will verbalize understanding of the stress management techniques in each of 2 treatment sessions to increase stress management skills post d/c.  Outcome: Not Progressing Treatment Session 1; Completed 0 out of 2: At approximately 2:40 pm, LRT attempted treatment session. Patient reported he was not feeling well and requested for LRT to come back tomorrow.  Jacquelynn CreeElizabeth M Berdina Cheever, LRT/CTRS 01.18.18 2:58 pm

## 2016-08-07 NOTE — Plan of Care (Signed)
Problem: Coping: Goal: Ability to cope will improve Outcome: Not Progressing Encourage  To work on Pharmacologistcoping skills

## 2016-08-07 NOTE — [facility] Group Notes (Signed)
[facility] Group Notes:  (Nursing/MHT/Case Management/Adjunct)  Date:  08/07/2016  Time:  6:15 PM  Type of Therapy:  Psychoeducational Skills  Participation Level:  Did Not Attend    [name] M [name] 08/07/2016, 6:15 PM 

## 2016-08-07 NOTE — Progress Notes (Signed)
Recreation Therapy Notes  Date: 01.18.18 Time: 1:00 pm Location: Craft Room  Group Topic: Leisure Education  Goal Area(s) Addresses:  Patient will identify activities for each letter of the alphabet. Patient will verbalize ability to integrate positive leisure into life post d/c. Patient will verbalize ability to use leisure as a Associate Professorcoping skill.  Behavioral Response: Attentive, Interactive  Intervention: Leisure Education  Activity: Patients were given a Leisure Information systems managerAlphabet worksheet. Patients were instructed to write leisure activities for each letter of the alphabet.  Education: LRT educated patients on leisure and why it is important.  Education Outcome: Acknowledges education/In group clarification offered   Clinical Observations/Feedback: Patient wrote healthy leisure activities. Patient contributed to group discussion by stating healthy leisure activities, and what he needs to participate in leisure.  Jacquelynn CreeGreene,French Kendra M, LRT/CTRS 08/07/2016 2:05 PM

## 2016-08-07 NOTE — Plan of Care (Signed)
Problem: Coping: Goal: Ability to verbalize frustrations and anger appropriately will improve Outcome: Not Met (add Reason) Patient remains irritable and upset. Med compliant. Vistaril 50 mg po given for anxiety and tylenol 650 mg po given at 2300 PRN. Slept 7 hours. Will continue to monitor for safety and behavior.

## 2016-08-07 NOTE — Progress Notes (Signed)
D: Patient has been argumentative though shift . Voice concerns about his blood in his  Urine . Voice of this being painfull . Argumentative  With  Phone calls  This shift . Patient was seen by Urologist  This shift . With security patient went for xray and  Ultrasound . Patient  Refused laxative . And 5 PM medications . Patient telling other peers on unit about his  Medication condition. Appetite good  Voice no concerns around sleep. Explanation  Given  Why he should take the laxative . A: Encourage patient participation with unit programming . Instruction  Given on  Medication , verbalize understanding. R: Voice no other concerns. Staff continue to monitor

## 2016-08-07 NOTE — Progress Notes (Signed)
Patient c/o bladder pain. Pain scale 6/10. Tylenol 650 mg po given at 2304 PRN for mild pain. UA results show squamous epithiall abnormal, mucus present and urine red abnormal. Dr Toni Amendlapacs paged. Awaiting call back. Pt resting quietly in bed. Will continue to monitor for safety and behavior.
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
## 2016-08-08 ENCOUNTER — Telehealth: Payer: Self-pay | Admitting: Urology

## 2016-08-08 DIAGNOSIS — N390 Urinary tract infection, site not specified: Secondary | ICD-10-CM | POA: Diagnosis present

## 2016-08-08 LAB — COMPREHENSIVE METABOLIC PANEL
ALBUMIN: 3.7 g/dL (ref 3.5–5.0)
ALT: 52 U/L (ref 17–63)
AST: 36 U/L (ref 15–41)
Alkaline Phosphatase: 54 U/L (ref 38–126)
Anion gap: 8 (ref 5–15)
BILIRUBIN TOTAL: 0.4 mg/dL (ref 0.3–1.2)
BUN: 14 mg/dL (ref 6–20)
CO2: 30 mmol/L (ref 22–32)
CREATININE: 0.82 mg/dL (ref 0.61–1.24)
Calcium: 8.9 mg/dL (ref 8.9–10.3)
Chloride: 98 mmol/L — ABNORMAL LOW (ref 101–111)
GFR calc Af Amer: >60 mL/min (ref >60)
GFR calc non Af Amer: >60 mL/min (ref >60)
GLUCOSE: 108 mg/dL — AB (ref 65–99)
POTASSIUM: 4.4 mmol/L (ref 3.5–5.1)
Sodium: 136 mmol/L (ref 135–145)
TOTAL PROTEIN: 6.9 g/dL (ref 6.5–8.1)

## 2016-08-08 LAB — LIPASE, BLOOD: Lipase: 22 U/L (ref 11–51)

## 2016-08-08 LAB — AMMONIA: Ammonia: 15 umol/L (ref 9–35)

## 2016-08-08 LAB — VALPROIC ACID LEVEL: Valproic Acid Lvl: 102 ug/mL — ABNORMAL HIGH (ref 50.0–100.0)

## 2016-08-08 LAB — HEMOGLOBIN A1C
HEMOGLOBIN A1C: 5.2 % (ref 4.8–5.6)
MEAN PLASMA GLUCOSE: 103 mg/dL

## 2016-08-08 MED ORDER — PRAZOSIN HCL 2 MG PO CAPS: 2.0000 mg | ORAL_CAPSULE | Freq: Two times a day (BID) | ORAL | 1 refills | Status: DC

## 2016-08-08 MED ORDER — CEPHALEXIN 500 MG PO CAPS
500.0000 mg | ORAL_CAPSULE | Freq: Four times a day (QID) | ORAL | Status: DC
  Administered 2016-08-08 (×2): 500 mg via ORAL
  Filled 2016-08-08 (×2): qty 1

## 2016-08-08 MED ORDER — DIVALPROEX SODIUM 500 MG PO DR TAB: 500.0000 mg | DELAYED_RELEASE_TABLET | Freq: Two times a day (BID) | ORAL | 1 refills | Status: DC

## 2016-08-08 MED ORDER — CEPHALEXIN 500 MG PO CAPS
500.0000 mg | ORAL_CAPSULE | Freq: Four times a day (QID) | ORAL | 0 refills | Status: DC
Start: 2016-08-08 — End: 2016-10-11

## 2016-08-08 MED ORDER — ZOLPIDEM TARTRATE 10 MG PO TABS
10.0000 mg | ORAL_TABLET | Freq: Every day | ORAL | 0 refills | Status: DC
Start: 2016-08-08 — End: 2016-08-08

## 2016-08-08 MED ORDER — ZOLPIDEM TARTRATE 10 MG PO TABS: 10.0000 mg | ORAL_TABLET | Freq: Every day | ORAL | 0 refills | Status: DC

## 2016-08-08 MED ORDER — DIVALPROEX SODIUM 500 MG PO DR TAB: 500.0000 mg | DELAYED_RELEASE_TABLET | Freq: Two times a day (BID) | ORAL | Status: DC
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
## 2016-08-08 NOTE — Telephone Encounter (Signed)
App has been made and patient's wife has been contacted.   michelle

## 2016-08-08 NOTE — Progress Notes (Signed)
Urology Consult Follow Up  Subjective: KUB/ RUS negative for stones.  UCx growing Staph aureus, urine culture sensitivities pending.  Continues to have mild hematuria, abdominal discomfort.  No fevers.    Anti-infectives: Anti-infectives    Start     Dose/Rate Route Frequency Ordered Stop   08/08/16 1200  cephALEXin (KEFLEX) capsule 500 mg     500 mg Oral Every 6 hours 08/08/16 0912 08/15/16 1159      Current Facility-Administered Medications  Medication Dose Route Frequency Provider Last Rate Last Dose  . acetaminophen (TYLENOL) tablet 650 mg  650 mg Oral Q6H PRN [name], MD   650 mg at 08/07/16 1530  . alum & mag hydroxide-simeth (MAALOX/MYLANTA) 200-200-20 MG/5ML suspension 30 mL  30 mL Oral Q4H PRN [name], MD      . cephALEXin (KEFLEX) capsule 500 mg  500 mg Oral Q6H Vanna Scotland, MD      . divalproex (DEPAKOTE) DR tablet 500 mg  500 mg Oral Q8H Jolanta B Pucilowska, MD   500 mg at 08/08/16 0654  . docusate sodium (COLACE) capsule 100 mg  100 mg Oral BID Jolanta B Pucilowska, MD      . hydrOXYzine (ATARAX/VISTARIL) tablet 25 mg  25 mg Oral TID PRN [name], MD   25 mg at 08/06/16 2124  . ibuprofen (ADVIL,MOTRIN) tablet 800 mg  800 mg Oral Q6H PRN Shari Prows, MD   800 mg at 08/07/16 1824  . magnesium citrate solution 1 Bottle  1 Bottle Oral Once Jolanta B Pucilowska, MD      . magnesium hydroxide (MILK OF MAGNESIA) suspension 30 mL  30 mL Oral Daily PRN [name], MD      . ondansetron (ZOFRAN) tablet 4 mg  4 mg Oral Q8H PRN Shari Prows, MD   4 mg at 08/08/16 0708  . polyethylene glycol (MIRALAX / GLYCOLAX) packet 17 g  17 g Oral Daily Jolanta B Pucilowska, MD      . prazosin (MINIPRESS) capsule 2 mg  2 mg Oral BID Shari Prows, MD   2 mg at 08/08/16 0827  . risperiDONE (RISPERDAL) tablet 2 mg  2 mg Oral QHS Jolanta B Pucilowska, MD   2 mg at 08/07/16 2119  . zolpidem (AMBIEN) tablet 10 mg  10 mg Oral QHS Jolanta B Pucilowska, MD    10 mg at 08/07/16 2119     Objective: Vital signs in last 24 hours:    Intake/Output from previous day: 01/18 0701 - 01/19 0700 In: 1080 [P.O.:1080] Out: -  Intake/Output this shift: No intake/output data recorded.   Physical Exam Exam deferred today  Lab Results:  No results for input(s): WBC, HGB, HCT, PLT in the last 72 hours. BMET  Recent Labs  08/08/16 0740  NA 136  K 4.4  CL 98*  CO2 30  GLUCOSE 108*  BUN 14  CREATININE 0.82  CALCIUM 8.9   PT/INR No results for input(s): LABPROT, INR in the last 72 hours. ABG No results for input(s): PHART, HCO3 in the last 72 hours.  Invalid input(s): PCO2, PO2  Studies/Results: Dg Abd 1 View  Result Date: 08/07/2016 CLINICAL DATA:  Pain in both kidneys, blood in urine for 1 week, catheter for neurogenic bladder EXAM: ABDOMEN - 1 VIEW COMPARISON:  CT abdomen and pelvis 01/01/2014 FINDINGS: Stool throughout colon to rectum. Nonobstructive bowel gas pattern. No bowel dilatation or bowel wall thickening. No urinary tract calcifications identified. Tiny pelvic phleboliths  noted. Bones demineralized. IMPRESSION: No urinary tract calcifications seen. Electronically Signed   By: Ulyses SouthwardMark  Boles M.D.   On: 08/07/2016 11:26   [place] Renal  Result Date: 08/07/2016 CLINICAL DATA:  Gross hematuria and history of renal calculi. EXAM: RENAL / URINARY TRACT ULTRASOUND COMPLETE COMPARISON:  CT of the abdomen and pelvis on 01/02/2012 FINDINGS: Right Kidney: Length: 11.3 cm. Echogenicity within normal limits. No mass or hydronephrosis visualized. No visualized shadowing calculi by ultrasound. Left Kidney: Length: 12.3 cm. Echogenicity within normal limits. No mass or hydronephrosis visualized. No visualized shadowing calculi by ultrasound. Bladder: Appears normal for degree of bladder distention. IMPRESSION: Normal renal ultrasound. Electronically Signed   By: Irish LackGlenn  Yamagata M.D.   On: 08/07/2016 15:50   Impression/ Plan:  31 year old male with site  psychiatric illness with 5 days of gross hematuria, right flank pain.    Results reviewed today with patient by phone.    1- Gross hematuria-  Likely secondary to UTI- UCx growing staph aurus  Started Keflex 500 mg qid x 7 days, adjust as needed based on sensitivity data  Encourage adequate hydration.    Outpatient cystoscopy recommended  2 -History of kidney stones-  KUB negative for stones.    RUS negative for hydronephrosis from possible obstructing stone   3- History of neurogenic bladder-  Continue to monitor, bladder scan PRN if feels not able to empty bladder   Urology to sign off, page with questions or concerns    LOS: 4 days    Vanna Scotlandshley Nikoloz Huy 08/08/2016

## 2016-08-08 NOTE — Progress Notes (Signed)
Recreation Therapy Notes  Date: 01.19.18 Time: 1:00 pm Location: Craft Room  Group Topic: Social Skills  Goal Area(s) Addresses:  Patient will effectively work with peer towards shared goal. Patient will identify skills used to make activities successful. Patient will identify benefit of using group skills effectively post d/c.  Behavioral Response: Attentive, Interactive  Intervention: Berkshire HathawayPipe Cleaner Tower  Activity: Patients divided into group. Patients were given 15 pipe cleaners and were instructed to build a free standing tower. Patients were given 2 minutes to strategize. After about 5 minutes of building, patients were instructed to put their dominant hand behind their back. After about 5 more minutes, patients were instructed to stop talking to each other.  Education: LRT educated patients on healthy support systems.  Education Outcome: Acknowledges education/In group clarification offered   Clinical Observations/Feedback: Patient worked with peer to build tower. Patient used effective communication, problem solving, and teamwork skills. Patient contributed to group discussion by stating what skills he used in group, why these skills are important, how it felt after using these skills in group, what prevents him from using these skills, and would would change for him if he started using these skills post d/c.  Jacquelynn CreeGreene,Zymiere Trostle M, LRT/CTRS 08/08/2016 2:06 PM

## 2016-08-08 NOTE — [facility] Suicide Risk Assessment (Signed)
[facility]   Principal Problem: Bipolar 2 disorder [facility](HCC) Discharge Diagnoses:  Patient Active Problem List   Diagnosis Date Noted  . UTI (urinary tract infection) [N39.0] 08/08/2016  . Gross hematuria [R31.0]   . Substance induced mood disorder (HCC) [F19.94] 08/04/2016  . Cannabis use disorder, moderate, dependence (HCC) [F12.20] 08/04/2016  . Tobacco use disorder [F17.200] 08/04/2016  . Cocaine use disorder, moderate, dependence (HCC) [F14.20] 06/26/2015  . Cocaine abuse with cocaine-induced mood disorder (HCC) [F14.14]   . Cocaine-induced mood disorder (HCC) [F14.94] 12/26/2014  . Bipolar 2 disorder (HCC) [F31.81] 12/26/2014  . Polysubstance dependence (HCC) [F19.20] 12/26/2014  . Fall [W19.XXXA] 12/22/2014    Total Time spent with patient: 30 minutes  Musculoskeletal: Strength & Muscle Tone: within normal limits Gait & Station: normal Patient leans: N/A  Psychiatric Specialty Exam: Review of Systems  Psychiatric/Behavioral: Positive for substance abuse.  All other systems reviewed and are negative.   Blood pressure 127/64, pulse 78, temperature 97.8 F (36.6 C), temperature source Oral, resp. rate 20, height 6\' 2"  (1.88 m), weight 102.5 kg (226 lb), SpO2 99 %.Body mass index is 29.02 kg/m.  General Appearance: Casual  Eye Contact::  Good  Speech:  Clear and Coherent409  Volume:  Normal  Mood:  Euthymic  Affect:  Appropriate  Thought Process:  Goal Directed and Descriptions of Associations: Intact  Orientation:  Full (Time, Place, and Person)  Thought Content:  WDL  Suicidal Thoughts:  No  Homicidal Thoughts:  No  Memory:  Immediate;   Fair Recent;   Fair Remote;   Fair  Judgement:  Poor  Insight:  Lacking  Psychomotor Activity:  Normal  Concentration:  Fair  Recall:  FiservFair  Fund of Knowledge:Fair  Language: Fair  Akathisia:  No  Handed:  Right  AIMS (if indicated):     Assets:  Communication Skills Desire for  Improvement Financial Resources/Insurance Housing Physical Health Resilience Social Support  Sleep:  Number of Hours: 6  Cognition: WNL  ADL's:  Intact   Mental Status Per Nursing Assessment::   On Admission:     Demographic Factors:  Male, Adolescent or young adult and Caucasian  Loss Factors: Loss of significant relationship and Financial problems/change in socioeconomic status  Historical Factors: Prior suicide attempts, Family history of mental illness or substance abuse and Impulsivity  Risk Reduction Factors:   Sense of responsibility to family and Positive social support  Continued Clinical Symptoms:  Bipolar Disorder:   Bipolar II Depressive phase Alcohol/Substance Abuse/Dependencies  Cognitive Features That Contribute To Risk:  None    Suicide Risk:  Minimal: No identifiable suicidal ideation.  Patients presenting with no risk factors but with morbid ruminations; may be classified as minimal risk based on the severity of the depressive symptoms    Plan Of Care/Follow-up recommendations:  Activity:  As tolerated. Diet:  Regular. Other:  Keep follow-up appointments.  Kristine LineaJolanta Stephie Xu, MD 08/08/2016, 12:11 PM

## 2016-08-08 NOTE — B[facility] Group Notes (Signed)
Goals Group: late entry Date/Time: 08/07/17 9:00 AM Type of Therapy and Topic: Group Therapy: Goals Group: SMART Goals   Participation Level: Moderate  Description of Group:    The purpose of a daily goals group is to assist and guide patients in setting recovery/wellness-related goals. The objective is to set goals as they relate to the crisis in which they were admitted. Patients will be using SMART goal modalities to set measurable goals. Characteristics of realistic goals will be discussed and patients will be assisted in setting and processing how one will reach their goal. Facilitator will also assist patients in applying interventions and coping skills learned in psycho-education groups to the SMART goal and process how one will achieve defined goal.   Therapeutic Goals:   -Patients will develop and document one goal related to or their crisis in which brought them into treatment.  -Patients will be guided by LCSW using SMART goal setting modality in how to set a measurable, attainable, realistic and time sensitive goal.  -Patients will process barriers in reaching goal.  -Patients will process interventions in how to overcome and successful in reaching goal.   Patient's Goal: To control my anger.  Therapeutic Modalities:  Motivational Interviewing  Research officer, political partyCognitive Behavioral Therapy  Crisis Intervention Model  SMART goals setting   Daleen SquibbGreg Seibert Keeter, [place]

## 2016-08-08 NOTE — B[facility] Group Notes (Signed)
[facility] LCSW Group Therapy   08/08/2016 9:30 AM   Type of Therapy: Group Therapy   Participation Level: Pt invited but did not attend.  Participation Quality: Pt invited but did not attend.  Summary of Progress/Problems: The topic for today was feelings about relapse. Pt discussed what relapse prevention is to them and identified triggers that they are on the path to relapse. Pt processed their feeling towards relapse and was able to relate to peers. Pt discussed coping skills that can be used for relapse prevention.    Hampton AbbotKadijah Novella Abraha, MSW, LCSWA 08/08/2016, 3:25PM

## 2016-08-08 NOTE — Plan of Care (Signed)
Problem: [facility] Participation in Recreation Therapeutic Interventions Goal: STG-Patient will demonstrate improved self esteem by identif STG: Self-Esteem - Within 4 treatment sessions, patient will verbalize at least 5 positive affirmation statements in each of 2 treatment sessions to increase self-esteem post d/c.  Outcome: Progressing Treatment Session 2; Completed 1 out of 2: At approximately 8:35 am, LRT met with patient in consultation room. Patient verbalized 5 positive affirmation statements. Patient reported it felt "good". LRT encouraged patient to continue saying positive affirmation statements.  Leonette Monarch, LRT/CTRS 01.18.19 11:42 am Goal: STG-Other Recreation Therapy Goal (Specify) STG: Stress Management - Within 4 treatment sessions, patient will verbalize understanding of the stress management techniques in each of 2 treatment sessions to increase stress management skills post d/c.  Outcome: Progressing Treatment Session 2; Completed 1 out of 2: At approximately 8:35 am, LRT met with patient in consultation room. LRT educated and provided patient with handouts on stress management techniques. Patient verbalized understanding. LRT encouraged patient to read over and practice the stress management techniques.  Leonette Monarch, LRT/CTRS 01.19.18 11:43 am

## 2016-08-08 NOTE — Progress Notes (Signed)
D: Patient denies SI/HI/AVH.  Patient is anxious but cooperative.  Patient feels that staff has not been respectful to him.  He also feels that we have not been proactive in finding out the cause of his hematuria and kidney pain.  Patient visible on the milieu.  A: Support and encouragement offered. Scheduled medications given to pt. Q 15 min checks continued for patient safety. R: Patient receptive. Patient remains safe on the unit.

## 2016-08-08 NOTE — Progress Notes (Signed)
D: Affect more cheerful this am shift . Continue to voice  Of discomfort . Bladder scan done flowing void  11 ml  Observed after . Patient abe to talk with MD this am shift . Stated MD informed of  Bladder infection  And he will be starting on antibiotic  Today. Patient also voice of getting a law suite going toward security . Voice of his privacy  Was invaded .Stated he has already talked to an attorney. Voice of nausea this am shift . Patient received  Medication  That was effective .  Stated his appetite was fair  energy low.  No Depression or anxiety or helplessness.   A: Encourage patient participation with unit programming . Instruction  Given on  Medication , verbalize understanding. R: Voice no other concerns. Staff continue to monitor

## 2016-08-08 NOTE — Progress Notes (Addendum)
D:Patient aware of discharge this shift . Patient returning home . Patient received all belonging locked up . Patient denies  Suicidal  And homicidal ideations  .  A: Writer instructed on discharge criteria  . Informed of 7 day supply  Of medication  Discharge Summary , Transitional Record Suicidal Risk Assesment and prescriptions  given to patientf . Aware  Of follow up appointment . R: Patient left unit with no questions  Or concerns  With wife.

## 2016-08-08 NOTE — Tx Team (Signed)
Interdisciplinary Treatment and Diagnostic Plan Update  08/08/2016 Time of Session: 10:30am [name] MRN: [id number]  Principal Diagnosis: Bipolar 2 disorder [facility])  Secondary Diagnoses: Principal Problem:   Bipolar 2 disorder (HCC) Active Problems:   Cocaine use disorder, moderate, dependence (HCC)   Substance induced mood disorder (HCC)   Cannabis use disorder, moderate, dependence (HCC)   Tobacco use disorder   Gross hematuria   UTI (urinary tract infection)   Current Medications:  Current Facility-Administered Medications  Medication Dose Route Frequency Provider Last Rate Last Dose  . acetaminophen (TYLENOL) tablet 650 mg  650 mg Oral Q6H PRN [name], MD   650 mg at 08/07/16 1530  . alum & mag hydroxide-simeth (MAALOX/MYLANTA) 200-200-20 MG/5ML suspension 30 mL  30 mL Oral Q4H PRN [name], MD      . cephALEXin (KEFLEX) capsule 500 mg  500 mg Oral Q6H Vanna Scotland, MD   500 mg at 08/08/16 1205  . divalproex (DEPAKOTE) DR tablet 500 mg  500 mg Oral Q12H Jolanta B Pucilowska, MD      . ibuprofen (ADVIL,MOTRIN) tablet 800 mg  800 mg Oral Q6H PRN Shari Prows, MD   800 mg at 08/07/16 1824  . magnesium hydroxide (MILK OF MAGNESIA) suspension 30 mL  30 mL Oral Daily PRN [name], MD      . ondansetron (ZOFRAN) tablet 4 mg  4 mg Oral Q8H PRN Shari Prows, MD   4 mg at 08/08/16 0708  . polyethylene glycol (MIRALAX / GLYCOLAX) packet 17 g  17 g Oral Daily Jolanta B Pucilowska, MD      . prazosin (MINIPRESS) capsule 2 mg  2 mg Oral BID Shari Prows, MD   2 mg at 08/08/16 0827  . risperiDONE (RISPERDAL) tablet 2 mg  2 mg Oral QHS Jolanta B Pucilowska, MD   2 mg at 08/07/16 2119  . zolpidem (AMBIEN) tablet 10 mg  10 mg Oral QHS Shari Prows, MD   10 mg at 08/07/16 2119   PTA Medications: Prescriptions Prior to Admission  Medication Sig Dispense Refill Last Dose  . carbamazepine (TEGRETOL) 200 MG tablet Take 1 tablet (200 mg  total) by mouth 3 (three) times daily. (Patient not taking: Reported on 08/03/2016) 90 tablet 0 Not Taking at Unknown time  . hydrOXYzine (ATARAX/VISTARIL) 50 MG tablet Take 1 tablet (50 mg total) by mouth every 6 (six) hours as needed for itching or anxiety. (Patient not taking: Reported on 08/03/2016) 60 tablet 0 Not Taking at Unknown time    Patient Stressors: Financial difficulties Medication change or noncompliance Substance abuse Traumatic event  Patient Strengths: Ability for insight Capable of independent living Communication skills Motivation for treatment/growth Supportive family/friends Work skills  Treatment Modalities: Medication Management, Group therapy, Case management,  1 to 1 session with clinician, Psychoeducation, Recreational therapy.   Physician Treatment Plan for Primary Diagnosis: Bipolar 2 disorder (HCC) Long Term Goal(s): Improvement in symptoms so as ready for discharge Improvement in symptoms so as ready for discharge   Short Term Goals: Ability to identify changes in lifestyle to reduce recurrence of condition will improve Ability to verbalize feelings will improve Ability to disclose and discuss suicidal ideas Ability to demonstrate self-control will improve Ability to identify and develop effective coping behaviors will improve Ability to maintain clinical measurements within normal limits will improve Compliance with prescribed medications will improve Ability to identify triggers associated with substance abuse/mental health issues will improve Ability to identify  changes in lifestyle to reduce recurrence of condition will improve Ability to demonstrate self-control will improve Ability to identify triggers associated with substance abuse/mental health issues will improve  Medication Management: Evaluate patient's response, side effects, and tolerance of medication regimen.  Therapeutic Interventions: 1 to 1 sessions, Unit Group sessions and  Medication administration.  Evaluation of Outcomes: Progressing  Physician Treatment Plan for Secondary Diagnosis: Principal Problem:   Bipolar 2 disorder (HCC) Active Problems:   Cocaine use disorder, moderate, dependence (HCC)   Substance induced mood disorder (HCC)   Cannabis use disorder, moderate, dependence (HCC)   Tobacco use disorder   Gross hematuria   UTI (urinary tract infection)  Long Term Goal(s): Improvement in symptoms so as ready for discharge Improvement in symptoms so as ready for discharge   Short Term Goals: Ability to identify changes in lifestyle to reduce recurrence of condition will improve Ability to verbalize feelings will improve Ability to disclose and discuss suicidal ideas Ability to demonstrate self-control will improve Ability to identify and develop effective coping behaviors will improve Ability to maintain clinical measurements within normal limits will improve Compliance with prescribed medications will improve Ability to identify triggers associated with substance abuse/mental health issues will improve Ability to identify changes in lifestyle to reduce recurrence of condition will improve Ability to demonstrate self-control will improve Ability to identify triggers associated with substance abuse/mental health issues will improve     Medication Management: Evaluate patient's response, side effects, and tolerance of medication regimen.  Therapeutic Interventions: 1 to 1 sessions, Unit Group sessions and Medication administration.  Evaluation of Outcomes: Progressing   RN Treatment Plan for Primary Diagnosis: Bipolar 2 disorder (HCC) Long Term Goal(s): Knowledge of disease and therapeutic regimen to maintain health will improve  Short Term Goals: Ability to verbalize frustration and anger appropriately will improve, Ability to demonstrate self-control, Ability to disclose and discuss suicidal ideas, Ability to identify and develop effective  coping behaviors will improve and Compliance with prescribed medications will improve  Medication Management: RN will administer medications as ordered by provider, will assess and evaluate patient's response and provide education to patient for prescribed medication. RN will report any adverse and/or side effects to prescribing provider.  Therapeutic Interventions: 1 on 1 counseling sessions, Psychoeducation, Medication administration, Evaluate responses to treatment, Monitor vital signs and CBGs as ordered, Perform/monitor CIWA, COWS, AIMS and Fall Risk screenings as ordered, Perform wound care treatments as ordered.  Evaluation of Outcomes: Progressing   LCSW Treatment Plan for Primary Diagnosis: Bipolar 2 disorder (HCC) Long Term Goal(s): Safe transition to appropriate next level of care at discharge, Engage patient in therapeutic group addressing interpersonal concerns.  Short Term Goals: Engage patient in aftercare planning with referrals and resources, Increase social support, Increase emotional regulation, Facilitate patient progression through stages of change regarding substance use diagnoses and concerns, Identify triggers associated with mental health/substance abuse issues and Increase skills for wellness and recovery  Therapeutic Interventions: Assess for all discharge needs, 1 to 1 time with Social worker, Explore available resources and support systems, Assess for adequacy in community support network, Educate family and significant other(s) on suicide prevention, Complete Psychosocial Assessment, Interpersonal group therapy.  Evaluation of Outcomes: Progressing   Progress in Treatment: Attending groups: Yes. Participating in groups: Yes. Taking medication as prescribed: Yes. Toleration medication: Yes. Family/Significant other contact made: Yes, individual(s) contacted:  wife Patient understands diagnosis: Yes. Discussing patient identified problems/goals with staff:  Yes. Medical problems stabilized or resolved: Yes. Denies suicidal/homicidal ideation: Yes.  Issues/concerns per patient self-inventory: No. Other: n/a  New problem(s) identified: None identified at this time.   New Short Term/Long Term Goal(s): None identified at this time.   Discharge Plan or Barriers: CSW assessing appropriate discharge plan.   Reason for Continuation of Hospitalization: Anxiety Depression Medication stabilization  Estimated Length of Stay: 3 to 5 days.   Attendees: Patient:Theodore Reinaldo MeekerCurtis Alexander 08/08/2016 2:05 PM  Physician: Dr. Kristine LineaJolanta Pucilowska, MD 08/08/2016 2:05 PM  Nursing: Hulan AmatoGwen Farrish, RN 08/08/2016 2:05 PM  RN Care Manager: 08/08/2016 2:05 PM  Social Worker: Clementeen HoofGregory J. Troy SineWierda, LCSW 08/08/2016 2:05 PM  Recreational Therapist: Jacquelynn CreeElizabeth M. Greene, LRT/CTRS 08/08/2016 2:05 PM  Other:  08/08/2016 2:05 PM  Other:  08/08/2016 2:05 PM  Other: 08/08/2016 2:05 PM    Scribe for Treatment Team: Arelia LongestAmaris G Laure Leone, LCSWA 08/08/2016 2:08 PM

## 2016-08-08 NOTE — Progress Notes (Signed)
  [facility] Case [facility] Discharge Plan :  Will you be returning to the same living situation after discharge:  Yes,  own trailer At discharge, do you have transportation home?: Yes,  wife Do you have the ability to pay for your medications: No. Med mgmt clinic referral made.  Release of information consent forms completed and in the chart;  Patient's signature needed at discharge.  Patient to Follow up at: Follow-up Information    [facility]. Go in 7 day(s).   Why:  Please to arrive to your hospital follow up appointment with [facility] during walk in hours of 8am-4pm.  Please bring your discharge paperwork and photo ID to this appt.  Any questions please contact the office: [phone]. Contact information: [address] [place], [place] [address] Phone: [phone] Fax: [phone]          Next level of care provider has access to [facility] Link:no  Safety Planning and Suicide Prevention discussed: Yes,  wife  Have you used any form of tobacco in the last 30 days? (Cigarettes, Smokeless Tobacco, Cigars, and/or Pipes): Yes  Has patient been referred to the Quitline?: Patient refused referral  Patient has been referred for addiction treatment: Yes  Lorri FrederickWierda, Arrayah Connors Jon, LCSW 08/08/2016, 4:22 PM

## 2016-08-09 LAB — URINE CULTURE

## 2016-08-11 NOTE — Progress Notes (Signed)
Recreation Therapy Notes  INPATIENT RECREATION TR PLAN  Patient Details Name: [name] MRN: [id number] DOB: 05/05/1986 Today's Date: 08/11/2016  Rec Therapy Plan Is patient appropriate for Therapeutic Recreation?: Yes Treatment times per week: At least once a week TR Treatment/Interventions: 1:1 session, Group participation (Comment) (Appropriate participation in daily recreational therapy tx)  Discharge Criteria Pt will be discharged from therapy if:: Treatment goals are met, Discharged Treatment plan/goals/alternatives discussed and agreed upon by:: Patient/family  Discharge Summary Short term goals set: See Care Plan Short term goals met: Adequate for discharge Progress toward goals comments: One-to-one attended Which groups?: Leisure education, Social skills, Other (Comment) (Self-expression) One-to-one attended: Self-esteem, stress management Reason goals not met: Patient refused one treatment session and patient d/c before goals could be met Therapeutic equipment acquired: N/A Reason patient discharged from therapy: Discharge from hospital Pt/family agrees with progress & goals achieved: Yes Date patient discharged from therapy: 08/08/16   Leonette Monarch, LRT/CTRS 08/11/2016, 8:33 AM

## 2016-09-02 ENCOUNTER — Encounter: Payer: Self-pay | Admitting: Urology

## 2016-09-02 ENCOUNTER — Other Ambulatory Visit: Payer: Self-pay | Admitting: Urology

## 2016-10-11 ENCOUNTER — Emergency Department
Admission: EM | Admit: 2016-10-11 | Discharge: 2016-10-11 | Disposition: A | Discharge status: Home / Self Care | Payer: Self-pay | Attending: Emergency Medicine | Admitting: Emergency Medicine

## 2016-10-11 ENCOUNTER — Emergency Department: Payer: Self-pay

## 2016-10-11 DIAGNOSIS — R310 Gross hematuria: Secondary | ICD-10-CM | POA: Insufficient documentation

## 2016-10-11 DIAGNOSIS — F17210 Nicotine dependence, cigarettes, uncomplicated: Secondary | ICD-10-CM | POA: Insufficient documentation

## 2016-10-11 DIAGNOSIS — M546 Pain in thoracic spine: Secondary | ICD-10-CM | POA: Insufficient documentation

## 2016-10-11 LAB — URINALYSIS, COMPLETE (UACMP) WITH MICROSCOPIC
Bacteria, UA: NONE SEEN
SQUAMOUS EPITHELIAL / LPF: NONE SEEN
Specific Gravity, Urine: 1.027 (ref 1.005–1.030)
WBC UA: NONE SEEN WBC/hpf (ref 0–5)

## 2016-10-11 LAB — BASIC METABOLIC PANEL
Anion gap: 8 (ref 5–15)
BUN: 17 mg/dL (ref 6–20)
CO2: 25 mmol/L (ref 22–32)
CREATININE: 0.82 mg/dL (ref 0.61–1.24)
Calcium: 9.6 mg/dL (ref 8.9–10.3)
Chloride: 104 mmol/L (ref 101–111)
GLUCOSE: 105 mg/dL — AB (ref 65–99)
Potassium: 3.7 mmol/L (ref 3.5–5.1)
SODIUM: 137 mmol/L (ref 135–145)

## 2016-10-11 LAB — CBC
HCT: 43.6 % (ref 40.0–52.0)
HEMOGLOBIN: 15.3 g/dL (ref 13.0–18.0)
MCH: 30.1 pg (ref 26.0–34.0)
MCHC: 35.1 g/dL (ref 32.0–36.0)
MCV: 86 fL (ref 80.0–100.0)
PLATELETS: 205 10*3/uL (ref 150–440)
RBC: 5.06 MIL/uL (ref 4.40–5.90)
RDW: 13.2 % (ref 11.5–14.5)
WBC: 11.2 10*3/uL — ABNORMAL HIGH (ref 3.8–10.6)

## 2016-10-11 MED ORDER — CEPHALEXIN 500 MG PO CAPS
500.0000 mg | ORAL_CAPSULE | Freq: Once | ORAL | Status: AC
  Administered 2016-10-11: 500 mg via ORAL
  Filled 2016-10-11: qty 1

## 2016-10-11 MED ORDER — SODIUM CHLORIDE 0.9 % IV BOLUS (SEPSIS)
1000.0000 mL | Freq: Once | INTRAVENOUS | Status: AC
  Administered 2016-10-11: 1000 mL via INTRAVENOUS

## 2016-10-11 MED ORDER — KETOROLAC TROMETHAMINE 30 MG/ML IJ SOLN
30.0000 mg | Freq: Once | INTRAMUSCULAR | Status: AC
  Administered 2016-10-11: 30 mg via INTRAVENOUS
  Filled 2016-10-11: qty 1

## 2016-10-11 MED ORDER — CEPHALEXIN 500 MG PO CAPS
500.0000 mg | ORAL_CAPSULE | Freq: Four times a day (QID) | ORAL | 0 refills | Status: AC
Start: 2016-10-11 — End: 2016-10-16

## 2016-10-11 MED ORDER — ETODOLAC 200 MG PO CAPS: 200.0000 mg | ORAL_CAPSULE | Freq: Three times a day (TID) | ORAL | 0 refills | Status: DC

## 2016-10-11 NOTE — Discharge Instructions (Signed)
Please make sure that she keep your urology appointment and follow-up to evaluate your hematuria.
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
## 2016-10-11 NOTE — ED Triage Notes (Signed)
Patient c/o hematuria X 2 days. Pt reports hx of the same. Pt was supposed to go for cystoscopy in February, however unable to make appointment.

## 2016-10-12 LAB — URINE CULTURE

## 2020-09-26 ENCOUNTER — Emergency Department: Admission: EM | Admit: 2020-09-26 | Discharge: 2020-09-26 | Discharge status: Against Medical Advice | Payer: Self-pay

## 2020-09-26 NOTE — ED Notes (Signed)
Pt has no complaints per EMS just wants to be checked.

## 2020-09-27 ENCOUNTER — Emergency Department
Admission: EM | Admit: 2020-09-27 | Discharge: 2020-09-27 | Disposition: A | Discharge status: Other Acute Inpatient Hospital | Payer: Self-pay | Attending: Emergency Medicine | Admitting: Emergency Medicine

## 2020-09-27 ENCOUNTER — Encounter: Payer: Self-pay | Admitting: Behavioral Health

## 2020-09-27 ENCOUNTER — Encounter: Payer: Self-pay | Admitting: Emergency Medicine

## 2020-09-27 ENCOUNTER — Other Ambulatory Visit: Payer: Self-pay

## 2020-09-27 ENCOUNTER — Inpatient Hospital Stay
Admission: RE | Admit: 2020-09-27 | Discharge: 2020-10-03 | DRG: 951 | Disposition: A | Discharge status: Home / Self Care | Payer: 59 | Source: Intra-hospital | Attending: Behavioral Health | Admitting: Behavioral Health

## 2020-09-27 DIAGNOSIS — F15950 Other stimulant use, unspecified with stimulant-induced psychotic disorder with delusions: Secondary | ICD-10-CM

## 2020-09-27 DIAGNOSIS — F3181 Bipolar II disorder: Secondary | ICD-10-CM | POA: Diagnosis present

## 2020-09-27 DIAGNOSIS — F1420 Cocaine dependence, uncomplicated: Secondary | ICD-10-CM | POA: Insufficient documentation

## 2020-09-27 DIAGNOSIS — R45851 Suicidal ideations: Secondary | ICD-10-CM | POA: Diagnosis present

## 2020-09-27 DIAGNOSIS — F1494 Cocaine use, unspecified with cocaine-induced mood disorder: Secondary | ICD-10-CM | POA: Diagnosis present

## 2020-09-27 DIAGNOSIS — Z20822 Contact with and (suspected) exposure to covid-19: Secondary | ICD-10-CM | POA: Insufficient documentation

## 2020-09-27 DIAGNOSIS — Z765 Malingerer [conscious simulation]: Secondary | ICD-10-CM | POA: Diagnosis not present

## 2020-09-27 DIAGNOSIS — F1220 Cannabis dependence, uncomplicated: Secondary | ICD-10-CM | POA: Diagnosis present

## 2020-09-27 DIAGNOSIS — F1510 Other stimulant abuse, uncomplicated: Secondary | ICD-10-CM | POA: Diagnosis present

## 2020-09-27 DIAGNOSIS — R4689 Other symptoms and signs involving appearance and behavior: Secondary | ICD-10-CM | POA: Insufficient documentation

## 2020-09-27 DIAGNOSIS — F1920 Other psychoactive substance dependence, uncomplicated: Secondary | ICD-10-CM | POA: Diagnosis present

## 2020-09-27 DIAGNOSIS — F1994 Other psychoactive substance use, unspecified with psychoactive substance-induced mood disorder: Secondary | ICD-10-CM | POA: Diagnosis present

## 2020-09-27 DIAGNOSIS — Z79899 Other long term (current) drug therapy: Secondary | ICD-10-CM | POA: Insufficient documentation

## 2020-09-27 DIAGNOSIS — F17210 Nicotine dependence, cigarettes, uncomplicated: Secondary | ICD-10-CM | POA: Diagnosis present

## 2020-09-27 DIAGNOSIS — F17200 Nicotine dependence, unspecified, uncomplicated: Secondary | ICD-10-CM | POA: Diagnosis present

## 2020-09-27 DIAGNOSIS — F1410 Cocaine abuse, uncomplicated: Secondary | ICD-10-CM

## 2020-09-27 DIAGNOSIS — F332 Major depressive disorder, recurrent severe without psychotic features: Secondary | ICD-10-CM | POA: Insufficient documentation

## 2020-09-27 DIAGNOSIS — R45850 Homicidal ideations: Secondary | ICD-10-CM | POA: Diagnosis present

## 2020-09-27 DIAGNOSIS — F1414 Cocaine abuse with cocaine-induced mood disorder: Secondary | ICD-10-CM | POA: Diagnosis not present

## 2020-09-27 LAB — URINE DRUG SCREEN, QUALITATIVE (ARMC ONLY)
Amphetamines, Ur Screen: POSITIVE — AB
Barbiturates, Ur Screen: NOT DETECTED
Benzodiazepine, Ur Scrn: NOT DETECTED
Cannabinoid 50 Ng, Ur ~~LOC~~: POSITIVE — AB
Cocaine Metabolite,Ur ~~LOC~~: POSITIVE — AB
MDMA (Ecstasy)Ur Screen: NOT DETECTED
Methadone Scn, Ur: NOT DETECTED
Opiate, Ur Screen: NOT DETECTED
Phencyclidine (PCP) Ur S: NOT DETECTED
Tricyclic, Ur Screen: NOT DETECTED

## 2020-09-27 LAB — BASIC METABOLIC PANEL
Anion gap: 10 (ref 5–15)
BUN: 14 mg/dL (ref 6–20)
CO2: 23 mmol/L (ref 22–32)
Calcium: 8.9 mg/dL (ref 8.9–10.3)
Chloride: 105 mmol/L (ref 98–111)
Creatinine, Ser: 0.85 mg/dL (ref 0.61–1.24)
GFR, Estimated: >60 mL/min (ref >60)
Glucose, Bld: 97 mg/dL (ref 70–99)
Potassium: 4 mmol/L (ref 3.5–5.1)
Sodium: 138 mmol/L (ref 135–145)

## 2020-09-27 LAB — CBC
HCT: 43.5 % (ref 39.0–52.0)
Hemoglobin: 14.9 g/dL (ref 13.0–17.0)
MCH: 29.3 pg (ref 26.0–34.0)
MCHC: 34.3 g/dL (ref 30.0–36.0)
MCV: 85.5 fL (ref 80.0–100.0)
Platelets: 250 10*3/uL (ref 150–400)
RBC: 5.09 MIL/uL (ref 4.22–5.81)
RDW: 13.2 % (ref 11.5–15.5)
WBC: 13.6 10*3/uL — ABNORMAL HIGH (ref 4.0–10.5)
nRBC: 0 % (ref 0.0–0.2)

## 2020-09-27 LAB — RESP PANEL BY RT-PCR (FLU A&B, COVID) ARPGX2
Influenza A by PCR: NEGATIVE
Influenza B by PCR: NEGATIVE
SARS Coronavirus 2 by RT PCR: NEGATIVE

## 2020-09-27 LAB — SALICYLATE LEVEL: Salicylate Lvl: <7 mg/dL — ABNORMAL LOW (ref 7.0–30.0)

## 2020-09-27 LAB — ETHANOL: Alcohol, Ethyl (B): <10 mg/dL (ref <10)

## 2020-09-27 LAB — ACETAMINOPHEN LEVEL: Acetaminophen (Tylenol), Serum: <10 ug/mL — ABNORMAL LOW (ref 10–30)

## 2020-09-27 MED ORDER — DIVALPROEX SODIUM 500 MG PO DR TAB
500.0000 mg | DELAYED_RELEASE_TABLET | Freq: Two times a day (BID) | ORAL | Status: DC
  Filled 2020-09-27: qty 1

## 2020-09-27 MED ORDER — ZIPRASIDONE HCL 20 MG PO CAPS
20.0000 mg | ORAL_CAPSULE | Freq: Three times a day (TID) | ORAL | Status: DC | PRN
Start: 2020-09-27 — End: 2020-10-03

## 2020-09-27 MED ORDER — TRAZODONE HCL 50 MG PO TABS
50.0000 mg | ORAL_TABLET | Freq: Every evening | ORAL | Status: DC | PRN
  Administered 2020-09-29 – 2020-10-01 (×3): 50 mg via ORAL
  Filled 2020-09-27 (×3): qty 1

## 2020-09-27 MED ORDER — MAGNESIUM HYDROXIDE 400 MG/5ML PO SUSP: 30.0000 mL | Freq: Every day | ORAL | Status: DC | PRN

## 2020-09-27 MED ORDER — LOPERAMIDE HCL 2 MG PO CAPS: 2.0000 mg | ORAL_CAPSULE | ORAL | Status: DC | PRN

## 2020-09-27 MED ORDER — ACETAMINOPHEN 325 MG PO TABS: 650.0000 mg | ORAL_TABLET | Freq: Four times a day (QID) | ORAL | Status: DC | PRN

## 2020-09-27 MED ORDER — ZIPRASIDONE MESYLATE 20 MG IM SOLR: 20.0000 mg | Freq: Four times a day (QID) | INTRAMUSCULAR | Status: DC | PRN

## 2020-09-27 MED ORDER — ALUM & MAG HYDROXIDE-SIMETH 200-200-20 MG/5ML PO SUSP: 30.0000 mL | ORAL | Status: DC | PRN

## 2020-09-27 MED ORDER — DIVALPROEX SODIUM 500 MG PO DR TAB
500.0000 mg | DELAYED_RELEASE_TABLET | Freq: Two times a day (BID) | ORAL | Status: DC
  Administered 2020-09-27: 500 mg via ORAL
  Filled 2020-09-27: qty 1

## 2020-09-27 MED ORDER — HYDROXYZINE HCL 50 MG PO TABS: 50.0000 mg | ORAL_TABLET | Freq: Three times a day (TID) | ORAL | Status: DC | PRN

## 2020-09-27 MED ORDER — ONDANSETRON HCL 4 MG PO TABS: 4.0000 mg | ORAL_TABLET | Freq: Three times a day (TID) | ORAL | Status: DC | PRN

## 2020-09-27 MED ORDER — RISPERIDONE 1 MG PO TABS
1.0000 mg | ORAL_TABLET | Freq: Every day | ORAL | Status: DC
  Filled 2020-09-27: qty 1
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
## 2020-09-27 NOTE — ED Notes (Signed)
Dr. Toni Amend MD at bedside for reevaulation.
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
## 2020-09-27 NOTE — Progress Notes (Signed)
Patient sleeping,did not wake up for dinner.

## 2020-09-27 NOTE — ED Notes (Signed)
Hourly rounding completed at this time, patient currently asleep in room. No complaints, stable, and in no acute distress. Q15 minute rounds and monitoring via Security Cameras to continue. 

## 2020-09-27 NOTE — ED Notes (Signed)
Pt provided with water and an extra blanket per request. Sitting on side of bed now eating sandwich
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
## 2020-09-27 NOTE — ED Notes (Signed)
Lunch tray and grape juice placed on chair, pt still sleeping. Pt notified of lunch but continues to sleep.

## 2020-09-27 NOTE — ED Notes (Addendum)
Calvin, TTS at bedside at this time.  

## 2020-09-27 NOTE — ED Notes (Signed)
Pt reports HI, denies SI, AVH. Pt restless and moving about bed in room during assessment. Speech somewhat incoherent at times. Pt able to express needs and requests food and water stating he hasn't ate in a week. Pt answering questions with one word answers and does not elaborate when prompted at this time.

## 2020-09-27 NOTE — ED Notes (Signed)
Patient transferred from ED to [facility] room 5 after screening for contraband. Report received from Penni Bombard, RN including Situation, Background, Assessment and Recommendations. Pt oriented to unit including Q15 minute rounds as well as the security cameras for their protection. Patient is alert and oriented, warm and dry in no acute distress. Patient denies SI and AVH. Pt. Encouraged to let this nurse know if needs arise.

## 2020-09-27 NOTE — Progress Notes (Signed)
Patient admitted from ED with Bipolar 2 disorder.Patient appears irritable and not willing to answer multiple questions. Patient states " I have all the stresses I am homeless,no money,no food." Patient cursed  People states " they are after me. " Did not specify who are" they ". Patient verbalized passive suicidal thoughts. Contracts for safety. Denies HI and AVH at this time.  Patient informed of fall risk status, fall risk assessed "low" at this time. Patient oriented to unit/staff/room. Patient denies any questions/concerns at this time. Patient safe on unit with Q15 minute checks for safety. Skin assessment and body search done,no contraband found.

## 2020-09-27 NOTE — ED Notes (Signed)
Pt currently sleeping at this time. Breakfast tray given to pt at this time.
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
## 2020-09-27 NOTE — Tx Team (Signed)
Initial Treatment Plan 09/27/2020 5:28 PM [name] LAG:[id number]    PATIENT STRESSORS: Financial difficulties Medication change or noncompliance Substance abuse   PATIENT STRENGTHS: Average or above average intelligence Communication skills Physical Health   PATIENT IDENTIFIED PROBLEMS: Irritability   Homeleeness  Depression                 DISCHARGE CRITERIA:  Ability to meet basic life and health needs Medical problems require only outpatient monitoring Verbal commitment to aftercare and medication compliance  PRELIMINARY DISCHARGE PLAN: Attend aftercare/continuing care group Placement in alternative living arrangements  PATIENT/FAMILY INVOLVEMENT: This treatment plan has been presented to and reviewed with the patient, [name], and/or family member,.  The patient and family have been given the opportunity to ask questions and make suggestions.  Leonarda Salon, RN 09/27/2020, 5:28 PM

## 2020-09-27 NOTE — ED Notes (Signed)
ED Provider at bedside. 

## 2020-09-27 NOTE — ED Notes (Signed)
With male Engineer, materials present, pt removes silver tone ring, red shirt, camo jacket, blue jeans, white socks, black shoes, orange boxers, cell phone & wallet--all placed in labeled pt belonging bag to be secured on nursing unit; pt changed into burgandy scrubs

## 2020-09-27 NOTE — ED Notes (Signed)
Pt given sandwich tray and water per request.

## 2020-09-28 DIAGNOSIS — F3181 Bipolar II disorder: Secondary | ICD-10-CM

## 2020-09-28 NOTE — Progress Notes (Signed)
Recreation Therapy Notes  INPATIENT RECREATION THERAPY ASSESSMENT  Patient Details Name: Clayten Allcock MRN: [id number] DOB: 06-26-1986 Today's Date: 09/28/2020       Information Obtained From: Patient (Patient refused)  Able to Participate in Assessment/Interview:    Patient Presentation:    Reason for Admission (Per Patient):    Patient Stressors:    Coping Skills:      Leisure Interests (2+):     Frequency of Recreation/Participation:    Awareness of Community Resources:     [employer]:     Current Use:    If no, Barriers?:    Expressed Interest in [employer] Information:    [place] of Residence:     Patient Main Form of Transportation:    Patient Strengths:     Patient Identified Areas of Improvement:     Patient Goal for Hospitalization:     Current SI (including self-harm):     Current HI:     Current AVH:    Staff Intervention Plan:    Consent to Intern Participation:    Birdie Fetty 09/28/2020, 2:07 PM

## 2020-09-28 NOTE — [facility] Group Notes (Signed)
LCSW Group Therapy Note     09/28/2020 2:48 PM     Type of Therapy and Topic:  Group Therapy:  Feelings around Relapse and Recovery     Participation Level:  Did Not Attend     Description of Group:    Patients in this group will discuss emotions they experience before and after a relapse. They will process how experiencing these feelings, or avoidance of experiencing them, relates to having a relapse. Facilitator will guide patients to explore emotions they have related to recovery. Patients will be encouraged to process which emotions are more powerful. They will be guided to discuss the emotional reaction significant others in their lives may have to their relapse or recovery. Patients will be assisted in exploring ways to respond to the emotions of others without this contributing to a relapse.     Therapeutic Goals:  1.    Patient will identify two or more emotions that lead to a relapse for them  2.    Patient will identify two emotions that result when they relapse  3.    Patient will identify two emotions related to recovery  4.    Patient will demonstrate ability to communicate their needs through discussion and/or role plays        Summary of Patient Progress:  X           Therapeutic Modalities:   Cognitive Behavioral Therapy  Solution-Focused Therapy  Assertiveness Training  Relapse Prevention Therapy        Eydie Wormley [place], MSW, LCSW-A  09/28/2020 2:48 PM
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
## 2020-09-28 NOTE — [facility] Counselor (Signed)
CSW attempted to do PSA with pt but pt declined to complete assessment this morning.   Kimon Loewen [place], MSW, LCSW-A 3/11/202210:35 AM

## 2020-09-28 NOTE — Tx Team (Signed)
Interdisciplinary Treatment and Diagnostic Plan Update  09/28/2020 Time of Session: 9:00AM [name] MRN: [id number]  Principal Diagnosis: Bipolar 2 disorder [facility])  Secondary Diagnoses: Principal Problem:   Bipolar 2 disorder ([place]) Active Problems:   Cocaine use disorder, moderate, dependence (HCC)   Cannabis use disorder, moderate, dependence (HCC)   Tobacco use disorder   Amphetamine abuse ([place])   Current Medications:  Current Facility-Administered Medications  Medication Dose Route Frequency Provider Last Rate Last Admin  . acetaminophen (TYLENOL) tablet 650 mg  650 mg Oral Q6H PRN Salley Scarlet, MD      . alum & mag hydroxide-simeth (MAALOX/MYLANTA) 200-200-20 MG/5ML suspension 30 mL  30 mL Oral Q4H PRN Salley Scarlet, MD      . divalproex (DEPAKOTE) DR tablet 500 mg  500 mg Oral Q12H Salley Scarlet, MD      . hydrOXYzine (ATARAX/VISTARIL) tablet 50 mg  50 mg Oral TID PRN Salley Scarlet, MD      . loperamide (IMODIUM) capsule 2 mg  2 mg Oral Q4H PRN Salley Scarlet, MD      . magnesium hydroxide (MILK OF MAGNESIA) suspension 30 mL  30 mL Oral Daily PRN Salley Scarlet, MD      . ondansetron [facility]) tablet 4 mg  4 mg Oral Q8H PRN Salley Scarlet, MD      . risperiDONE (RISPERDAL) tablet 1 mg  1 mg Oral QHS Salley Scarlet, MD      . traZODone (DESYREL) tablet 50 mg  50 mg Oral QHS PRN Salley Scarlet, MD      . ziprasidone (GEODON) capsule 20 mg  20 mg Oral Q8H PRN Salley Scarlet, MD      . ziprasidone (GEODON) injection 20 mg  20 mg Intramuscular Q6H PRN Salley Scarlet, MD       PTA Medications: Medications Prior to Admission  Medication Sig Dispense Refill Last Dose  . divalproex (DEPAKOTE) 500 MG DR tablet Take 1 tablet (500 mg total) by mouth every 12 (twelve) hours. 60 tablet 1   . etodolac (LODINE) 200 MG capsule Take 1 capsule (200 mg total) by mouth every 8 (eight) hours. 12 capsule 0   . prazosin (MINIPRESS) 2 MG capsule Take 1 capsule (2  mg total) by mouth 2 (two) times daily. 60 capsule 1   . zolpidem (AMBIEN) 10 MG tablet Take 1 tablet (10 mg total) by mouth at bedtime. 30 tablet 0     Patient Stressors: Financial difficulties Medication change or noncompliance Substance abuse  Patient Strengths: Average or above average intelligence Communication skills Physical Health  Treatment Modalities: Medication Management, Group therapy, Case management,  1 to 1 session with clinician, Psychoeducation, Recreational therapy.   Physician Treatment Plan for Primary Diagnosis: Bipolar 2 disorder ([place]) Long Term Goal(s):     Short Term Goals:    Medication Management: Evaluate patient's response, side effects, and tolerance of medication regimen.  Therapeutic Interventions: 1 to 1 sessions, Unit Group sessions and Medication administration.  Evaluation of Outcomes: Not Met  Physician Treatment Plan for Secondary Diagnosis: Principal Problem:   Bipolar 2 disorder ([place]) Active Problems:   Cocaine use disorder, moderate, dependence (HCC)   Cannabis use disorder, moderate, dependence (HCC)   Tobacco use disorder   Amphetamine abuse ([place])  Long Term Goal(s):     Short Term Goals:       Medication Management: Evaluate patient's response, side effects, and tolerance of medication regimen.  Therapeutic  Interventions: 1 to 1 sessions, Unit Group sessions and Medication administration.  Evaluation of Outcomes: Not Met   RN Treatment Plan for Primary Diagnosis: Bipolar 2 disorder ([place]) Long Term Goal(s): Knowledge of disease and therapeutic regimen to maintain health will improve  Short Term Goals: Ability to remain free from injury will improve, Ability to verbalize frustration and anger appropriately will improve, Ability to demonstrate self-control, Ability to participate in decision making will improve, Ability to verbalize feelings will improve, Ability to identify and develop effective coping behaviors will improve  and Compliance with prescribed medications will improve  Medication Management: RN will administer medications as ordered by provider, will assess and evaluate patient's response and provide education to patient for prescribed medication. RN will report any adverse and/or side effects to prescribing provider.  Therapeutic Interventions: 1 on 1 counseling sessions, Psychoeducation, Medication administration, Evaluate responses to treatment, Monitor vital signs and CBGs as ordered, Perform/monitor CIWA, COWS, AIMS and Fall Risk screenings as ordered, Perform wound care treatments as ordered.  Evaluation of Outcomes: Not Met   LCSW Treatment Plan for Primary Diagnosis: Bipolar 2 disorder ([place]) Long Term Goal(s): Safe transition to appropriate next level of care at discharge, Engage patient in therapeutic group addressing interpersonal concerns.  Short Term Goals: Engage patient in aftercare planning with referrals and resources, Increase social support, Increase ability to appropriately verbalize feelings, Increase emotional regulation, Facilitate acceptance of mental health diagnosis and concerns, Facilitate patient progression through stages of change regarding substance use diagnoses and concerns, Identify triggers associated with mental health/substance abuse issues and Increase skills for wellness and recovery  Therapeutic Interventions: Assess for all discharge needs, 1 to 1 time with Social worker, Explore available resources and support systems, Assess for adequacy in community support network, Educate family and significant other(s) on suicide prevention, Complete Psychosocial Assessment, Interpersonal group therapy.  Evaluation of Outcomes: Not Met   Progress in Treatment: Attending groups: No. Participating in groups: No. Taking medication as prescribed: Yes. Toleration medication: Yes. Family/Significant other contact made: No, will contact:  when given permission. Patient  understands diagnosis: No. Discussing patient identified problems/goals with staff: No. Medical problems stabilized or resolved: Yes. Denies suicidal/homicidal ideation: Yes. Issues/concerns per patient self-inventory: No. Other: None.  New problem(s) identified: No, Describe:  none.  New Short Term/Long Term Goal(s): detox, elimination of symptoms of psychosis, medication management for mood stabilization; elimination of SI thoughts; development of comprehensive mental wellness/sobriety plan.  Patient Goals: Pt declined to participate in the treatment team meeting even though invitation was extended by staff.   Discharge Plan or Barriers: CSW will assist pt with development of an appropriate aftercare/discharge plan.   Reason for Continuation of Hospitalization: Medication stabilization Withdrawal symptoms Other; describe psychosis  Estimated Length of Stay: 1-7 days  Attendees: Patient: [name] 09/28/2020 9:30 AM  Physician: Selina Cooley, MD 09/28/2020 9:30 AM  Nursing: Collier Bullock, RN 09/28/2020 9:30 AM  RN Care Manager: 09/28/2020 9:30 AM  Social Worker: Chalmers Guest. Guerry Bruin, MSW, [place], [place] 09/28/2020 9:30 AM  Recreational Therapist: Devin Going, LRT  09/28/2020 9:30 AM  Other: Assunta Curtis, MSW, LCSW 09/28/2020 9:30 AM  Other: Kiva Martinique, MSW, LCSW-A 09/28/2020 9:30 AM  Other: 09/28/2020 9:30 AM    Scribe for Treatment Team: Shirl Harris, LCSW 09/28/2020 9:30 AM

## 2020-09-28 NOTE — Progress Notes (Signed)
Recreation Therapy Notes  INPATIENT RECREATION TR PLAN  Patient Details Name: [name] MRN: [id number] DOB: 17-Feb-1986 Today's Date: 09/28/2020  Rec Therapy Plan Is patient appropriate for Therapeutic Recreation?: Yes Treatment times per week: at least 3 Estimated Length of Stay: 5-7 days TR Treatment/Interventions: Group participation (Comment)  Discharge Criteria Pt will be discharged from therapy if:: Discharged Treatment plan/goals/alternatives discussed and agreed upon by:: Patient/family  Discharge Summary     [name] 09/28/2020, 2:08 PM

## 2020-09-28 NOTE — Progress Notes (Signed)
Pt refuses to be assessed and go to breakfast. Torrie Mayers RN

## 2020-09-28 NOTE — Progress Notes (Signed)
Recreation Therapy Notes   Date: 09/28/2020  Time: 9:30 am   Location: Craft room    Behavioral response: N/A   Intervention Topic: Goals    Discussion/Intervention: Patient did not attend group.   Clinical Observations/Feedback:  Patient did not attend group.   Monai Hindes LRT/CTRS        Dixie Jafri 09/28/2020 11:21 AM

## 2020-09-28 NOTE — Progress Notes (Signed)
Pt rates depression 10/10, anxiety 8/10. Pt  has SI and HI but contracts for safety. Pt has AVH. Torrie Mayers RN

## 2020-09-28 NOTE — [facility] Group Notes (Signed)
[facility] Group Notes:  (Nursing/MHT/Case Management/Adjunct)  Date:  09/28/2020  Time:  6:15 AM  Type of Therapy:  wrap up  Participation Level:  Did Not Attend    [name] 09/28/2020, 6:15 AM

## 2020-09-28 NOTE — Progress Notes (Signed)
Pt stated that he leaned over and heard something "pop". He said it was his hernia. He says that he had surgery in Oct 2021 at [facility]. Pain 6/10. Pt is walking around in the halls and still social in the. dayroom. Pt says that he does not want "nothing" for it (for the pain). Pt was educated. Dr.Freeman aware. She said if it turns purple or in constant pain call [facility] urgently and Dr. Gilmore Laroche if the first doesn't answer. Torrie Mayers RN

## 2020-09-28 NOTE — Plan of Care (Signed)
Pt will not answer questions when assessing. Pt is withdrawn saying "I'm not answering that", Torrie Mayers RN Problem: Education: Goal: Knowledge of [place] [facility] Education information/materials will improve Outcome: Not Progressing Goal: Emotional status will improve Outcome: Not Progressing Goal: Mental status will improve Outcome: Not Progressing Goal: Verbalization of understanding the information provided will improve Outcome: Not Progressing   Problem: Health Behavior/Discharge Planning: Goal: Identification of resources available to assist in meeting health care needs will improve Outcome: Not Progressing Goal: Compliance with treatment plan for underlying cause of condition will improve Outcome: Not Progressing   Problem: Activity: Goal: Will identify at least one activity in which they can participate Outcome: Not Progressing   Problem: Coping: Goal: Ability to identify and develop effective coping behavior will improve Outcome: Not Progressing Goal: Ability to interact with others will improve Outcome: Not Progressing Goal: Demonstration of participation in decision-making regarding own care will improve Outcome: Not Progressing   Problem: Self-Concept: Goal: Will verbalize positive feelings about self Outcome: Not Progressing

## 2020-09-28 NOTE — [facility] Suicide Risk Assessment (Signed)
[facility] Admission Suicide Risk Assessment   Nursing information obtained from:  Patient Demographic factors:  Male,Caucasian,Unemployed,Living alone Current Mental Status:  NA Loss Factors:  Financial problems / change in socioeconomic status Historical Factors:  Impulsivity Risk Reduction Factors:  NA  Total Time spent with patient: 30 minutes Principal Problem: Bipolar 2 disorder (HCC) Diagnosis:  Principal Problem:   Bipolar 2 disorder (HCC) Active Problems:   Cocaine use disorder, moderate, dependence (HCC)   Cannabis use disorder, moderate, dependence (HCC)   Tobacco use disorder   Amphetamine abuse (HCC)  Subjective Data: was noted to be paranoid on exam. When he arrived to our unit he endorsed passive suicidal thoughts, and feelings that people were after him. No acute overnight events, medication non-compliant, ADLS impaired.   Patient refused to come to treatment team this morning. He is also largely non-cooperative with examination today. He simply states "Well, I'm here" when asked how he was feeling. He is very irritable on exam, and refuses to open eyes to engage in conversation. He does deny any withdrawals from substances- specifically no nausea, diarrhea, tremors, or headache.  Per chart review he endorsed multiple life stressors including homelessness, lack of money, and lack of food.   Continued Clinical Symptoms:  Alcohol Use Disorder Identification Test Final Score (AUDIT): 2 The "Alcohol Use Disorders Identification Test", Guidelines for Use in Primary Care, Second Edition.  World Science writer [facility]). Score between 0-7:  no or low risk or alcohol related problems. Score between 8-15:  moderate risk of alcohol related problems. Score between 16-19:  high risk of alcohol related problems. Score 20 or above:  warrants further diagnostic evaluation for alcohol dependence and treatment.   CLINICAL FACTORS:   Bipolar Disorder:   Mixed State Alcohol/Substance  Abuse/Dependencies Currently Psychotic Unstable or Poor Therapeutic Relationship Previous Psychiatric Diagnoses and Treatments   Musculoskeletal: Strength & Muscle Tone: within normal limits Gait & Station: normal Patient leans: N/A  Psychiatric Specialty Exam:  Presentation  General Appearance: Casual  Eye Contact:None  Speech:Normal Rate  Speech Volume:Normal  Handedness:Right   Mood and Affect  Mood:Irritable  Affect:Congruent   Thought Process  Thought Processes:Goal Directed  Descriptions of Associations:Intact  Orientation:Full (Time, Place and Person)  Thought Content:Paranoid Ideation  History of Schizophrenia/Schizoaffective disorder:No  Duration of Psychotic Symptoms:No data recorded Hallucinations:Hallucinations: None  Ideas of Reference:Paranoia  Suicidal Thoughts:Suicidal Thoughts: No  Homicidal Thoughts:Homicidal Thoughts: Yes, Active HI Passive Intent and/or Plan: Without Intent   Sensorium  Memory:Immediate Fair  Judgment:Intact  Insight:Shallow   Executive Functions  Concentration:Poor  Attention Span:Poor  Recall:Poor  Fund of Knowledge:Poor  Language:Poor   Psychomotor Activity  Psychomotor Activity:Psychomotor Activity: Decreased   Assets  Assets:Desire for Improvement; Financial Resources/Insurance; Physical Health   Sleep  Sleep:Sleep: Poor    Physical Exam: Physical Exam ROS Blood pressure 122/82, pulse 92, temperature 98.3 F (36.8 C), temperature source Oral, resp. rate 18, height 6\' 2"  (1.88 m), weight 92.5 kg, SpO2 100 %. Body mass index is 26.19 kg/m.   COGNITIVE FEATURES THAT CONTRIBUTE TO RISK:  Closed-mindedness    SUICIDE RISK:   Mild:  Suicidal ideation of limited frequency, intensity, duration, and specificity.  There are no identifiable plans, no associated intent, mild dysphoria and related symptoms, good self-control (both objective and subjective assessment), few other risk factors,  and identifiable protective factors, including available and accessible social support.  PLAN OF CARE: Continue admission, see H&P for details.   I certify that inpatient services furnished can reasonably be expected  to improve the patient's condition.   Jesse Sans, MD 09/28/2020, 2:15 PM

## 2020-09-29 ENCOUNTER — Encounter: Payer: Self-pay | Admitting: Behavioral Health

## 2020-09-29 NOTE — Progress Notes (Signed)
Patient refused QHS medication this evening. Stating that he does not take medication, but admits that it is mostly do to with compensating with substance use. He is alert and cooperative and very forth coming. Reports having court dates that he may have missed and he has concerns that will cause more problems for him. He denies SI  HI  AV depression anxiety and pain at this encounter. Even though he denies he presents anxious during our conversation. This Clinical research associate provided him with the phone number to his lawyer and a list of [employer] as he has plans to go to a sober living house once he  leaves the hospital. He was encouraged to come to staff with any concerns and remains safe on the unit with 15 minute safety rounds.    Cleo Butler-Nicholson, LPN

## 2020-09-29 NOTE — Progress Notes (Signed)
Patient approached nurses station wanting medication to aid in sleep. PRN medication provided and tolerated without incident.    Theodore Butler-Nicholson, LPN

## 2020-09-29 NOTE — Progress Notes (Signed)
Patient alert and oriented x 3, affect is Flat but upon approach, he denies SI/HI/AVH, he was not receptive to staff, he currently denies SI/HI/AVH, he refused night time medication, 15 minutes safety checks maintained will continue to monitor.

## 2020-09-29 NOTE — [facility] Group Notes (Signed)
BHH LCSW Group Therapy Note  Date/Time: 09/29/2020 @ 1pm  Type of Therapy/Topic:  Group Therapy:  Feelings about Diagnosis  Participation Level:  Did Not Attend   Mood:  Did not attend   Description of Group:    This group will allow patients to explore their thoughts and feelings about diagnoses they have received. Patients will be guided to explore their level of understanding and acceptance of these diagnoses. Facilitator will encourage patients to process their thoughts and feelings about the reactions of others to their diagnosis, and will guide patients in identifying ways to discuss their diagnosis with significant others in their lives. This group will be process-oriented, with patients participating in exploration of their own experiences as well as giving and receiving support and challenge from other group members.   Therapeutic Goals: 1. Patient will demonstrate understanding of diagnosis as evidence by identifying two or more symptoms of the disorder:  2. Patient will be able to express two feelings regarding the diagnosis 3. Patient will demonstrate ability to communicate their needs through discussion and/or role plays  Summary of Patient Progress:    Patient did not attend group today.     Therapeutic Modalities:   Cognitive Behavioral Therapy Brief Therapy Feelings Identification   Stephannie Peters, LCSW
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
## 2020-09-29 NOTE — Plan of Care (Signed)
°  Problem: Education: Goal: Emotional status will improve Outcome: Progressing Goal: Mental status will improve Outcome: Progressing Goal: Verbalization of understanding the information provided will improve Outcome: Progressing   Problem: Health Behavior/Discharge Planning: Goal: Identification of resources available to assist in meeting health care needs will improve Outcome: Progressing   Problem: Coping: Goal: Ability to interact with others will improve Outcome: Progressing

## 2020-09-29 NOTE — Plan of Care (Signed)
Guarded and resistant to care. Has been in bed and verbally aggressive upon approach, yelling, calling names. Requested not to be disturbed and refused AM medication and breakfast. Safety precautions reinforced.

## 2020-09-29 NOTE — Progress Notes (Signed)
Patient alert and oriented x 3, affect is flat ,he denies SI/HI/AVH, he was not receptive to staff, he currently denies SI/HI/AVH, he refused night time medication, 15 minutes safety checks maintained will continue to monitor.

## 2020-09-30 NOTE — Plan of Care (Signed)
Patient has been out of bed for meals but continues to refuse medications "I just don't like taking medications". Guarded and irritable, not willing to discuss. Was observed taliking with other patients and appeared to be pleasant with them. He is resistant to care. Stated that he will call staff if help needed. Patient is otherwise cooperative on the unit. No aggressive behaviors noted. Safety precautions reinforced.

## 2020-09-30 NOTE — Progress Notes (Signed)
Patient has stayed remained resistant to care, refusing medications and refusing to participate in SW assessment upon multiple attempts. He ata his meals as serviced but continues to refuse medications. Continues to avoid to discuss with staff. He is not aggressive but can be irritable upon approach. Was encouraged to call staff when ready to talk.

## 2020-09-30 NOTE — [facility] Counselor (Signed)
CSW attempted to complete assessment with patient. Patient did decline to complete assessment.  This is patient's 2nd attempt at completing assessment.    [name], MSW, LCSW Licensed Visual merchandiser - PRN (Transition of Care Team) [facility]  [place] [facility]

## 2020-09-30 NOTE — [facility] Group Notes (Signed)
[facility] LCSW Group Therapy Note  Date/Time: 09/30/2020 @ 1pm  Type of Therapy and Topic:  Group Therapy:  Overcoming Obstacles  Participation Level:  [facility] PARTICIPATION LEVEL: Did Not Attend  Description of Group:    In this group patients will be encouraged to explore what they see as obstacles to their own wellness and recovery. They will be guided to discuss their thoughts, feelings, and behaviors related to these obstacles. The group will process together ways to cope with barriers, with attention given to specific choices patients can make. Each patient will be challenged to identify changes they are motivated to make in order to overcome their obstacles. This group will be process-oriented, with patients participating in exploration of their own experiences as well as giving and receiving support and challenge from other group members.  Therapeutic Goals: 1. Patient will identify personal and current obstacles as they relate to admission. 2. Patient will identify barriers that currently interfere with their wellness or overcoming obstacles.  3. Patient will identify feelings, thought process and behaviors related to these barriers. 4. Patient will identify two changes they are willing to make to overcome these obstacles:    Summary of Patient Progress   Patient did not attend group therapy today.    Therapeutic Modalities:   Cognitive Behavioral Therapy Solution Focused Therapy Motivational Interviewing Relapse Prevention Therapy   Marjarie Irion, LCSW  

## 2020-09-30 NOTE — Plan of Care (Signed)
  Problem: Education: Goal: Knowledge of [facility] Education information/materials will improve Outcome: Progressing Goal: Emotional status will improve Outcome: Progressing Goal: Mental status will improve Outcome: Progressing Goal: Verbalization of understanding the information provided will improve Outcome: Progressing   Problem: Health Behavior/Discharge Planning: Goal: Identification of resources available to assist in meeting health care needs will improve Outcome: Progressing Goal: Compliance with treatment plan for underlying cause of condition will improve Outcome: Progressing   Problem: Activity: Goal: Will identify at least one activity in which they can participate Outcome: Progressing   Problem: Coping: Goal: Ability to identify and develop effective coping behavior will improve Outcome: Progressing Goal: Ability to interact with others will improve Outcome: Progressing Goal: Demonstration of participation in decision-making regarding own care will improve Outcome: Progressing   Problem: Self-Concept: Goal: Will verbalize positive feelings about self Outcome: Progressing
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
## 2020-10-01 MED ORDER — ARIPIPRAZOLE 10 MG PO TABS
10.0000 mg | ORAL_TABLET | Freq: Every day | ORAL | Status: DC
  Administered 2020-10-01 – 2020-10-02 (×2): 10 mg via ORAL
  Filled 2020-10-01 (×2): qty 1

## 2020-10-01 NOTE — Plan of Care (Signed)
  Problem: Education: Goal: Knowledge of [facility] Education information/materials will improve Outcome: Progressing Goal: Emotional status will improve Outcome: Progressing Goal: Mental status will improve Outcome: Progressing Goal: Verbalization of understanding the information provided will improve Outcome: Progressing   Problem: Health Behavior/Discharge Planning: Goal: Identification of resources available to assist in meeting health care needs will improve Outcome: Progressing Goal: Compliance with treatment plan for underlying cause of condition will improve Outcome: Progressing   Problem: Activity: Goal: Will identify at least one activity in which they can participate Outcome: Progressing   Problem: Coping: Goal: Ability to identify and develop effective coping behavior will improve Outcome: Progressing Goal: Ability to interact with others will improve Outcome: Progressing Goal: Demonstration of participation in decision-making regarding own care will improve Outcome: Progressing   Problem: Self-Concept: Goal: Will verbalize positive feelings about self Outcome: Progressing   
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
## 2020-10-01 NOTE — Progress Notes (Signed)
Recreation Therapy Notes  Date: 10/01/2020  Time: 9:30 am   Location: Craft room    Behavioral response: N/A   Intervention Topic: Time Management   Discussion/Intervention: Patient did not attend group.   Clinical Observations/Feedback:  Patient did not attend group.   Salena Ortlieb LRT/CTRS          Kampbell Holaway 10/01/2020 11:37 AM

## 2020-10-01 NOTE — Progress Notes (Signed)
Patient approached the nurses station requesting medication to help aid in sleep. PRN medication provided and received without incident.   Cleo Butler-Nicholson, LPN
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
## 2020-10-01 NOTE — BHH Counselor (Signed)
CSW spoke with the  Patient and provided list of [employer].  Patient did note that he had a list, however it did not identify openings. Patient reports that he has made no calls from that list.  CSW encouraged pt to begin making calls.   CSW also finished and faxed the referral to [facility], fax was successful.  Penni Homans, MSW, LCSW 10/01/2020 3:26 PM

## 2020-10-01 NOTE — BHH Group Notes (Signed)
LCSW Group Therapy Note   10/01/2020 3:24 PM  Type of Therapy and Topic:  Group Therapy:  Overcoming Obstacles   Participation Level:  Did Not Attend   Description of Group:    In this group patients will be encouraged to explore what they see as obstacles to their own wellness and recovery. They will be guided to discuss their thoughts, feelings, and behaviors related to these obstacles. The group will process together ways to cope with barriers, with attention given to specific choices patients can make. Each patient will be challenged to identify changes they are motivated to make in order to overcome their obstacles. This group will be process-oriented, with patients participating in exploration of their own experiences as well as giving and receiving support and challenge from other group members.   Therapeutic Goals: 1. Patient will identify personal and current obstacles as they relate to admission. 2. Patient will identify barriers that currently interfere with their wellness or overcoming obstacles.  3. Patient will identify feelings, thought process and behaviors related to these barriers. 4. Patient will identify two changes they are willing to make to overcome these obstacles:      Summary of Patient Progress X   Therapeutic Modalities:   Cognitive Behavioral Therapy Solution Focused Therapy Motivational Interviewing Relapse Prevention Therapy  Penni Homans, MSW, LCSW 10/01/2020 3:24 PM

## 2020-10-01 NOTE — BHH Suicide Risk Assessment (Addendum)
CSW spoke with patient at length about treatment options.  Patient declined [facility], [facility], [facility], [address] and [facility] at this time.  Patient was only open to [facility] at this time.     CSW reviewed with the patient homeless status and discussed [employer].  Patient declined [employer] stating that he used to drive a bus in the city of [place] and that DRM allowed for easy access to substances.  Patient reports that he is not religious and did not wish to go to a religious program.  CSW informed that at last contact [facility] didn't have a beds but was working off a waitlist and the shelter in town, [employer] [facility] tends to be full.  Patient again declined shelter resources.  Pt did ask for list of [employer] vacancies for [facility] and [facility].  CSW has printed the list and will provide for pt.    Penni Homans, MSW, LCSW 10/01/2020 11:48 AM

## 2020-10-01 NOTE — Plan of Care (Signed)
Patient little irritable this morning states " I don't feel much better." Patient verbalized passive homicidal ideations towards someone. Patient did not mention who that is.Denies HI and AVH.Patient stated that he could sleep after the sleep medicine. Refused to take Depakote. States " it does not help with my mood." Patient stays in bed mostly. Did not attend groups. Appetite and energy level good. Support and encouragement given.
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
## 2020-10-01 NOTE — BHH Suicide Risk Assessment (Signed)
BHH INPATIENT:  Family/Significant Other Suicide Prevention Education  Suicide Prevention Education:  Patient Refusal for Family/Significant Other Suicide Prevention Education: The patient [name] has refused to provide written consent for family/significant other to be provided Family/Significant Other Suicide Prevention Education during admission and/or prior to discharge.  Physician notified.  SPE completed with pt, as pt refused to consent to family contact. SPI pamphlet provided to pt and pt was encouraged to share information with support network, ask questions, and talk about any concerns relating to SPE. Pt denies access to guns/firearms and verbalized understanding of information provided. Mobile Crisis information also provided to pt.   Harden Mo 10/01/2020, 11:38 AM

## 2020-10-02 NOTE — BHH Counselor (Signed)
CSW spoke with patient who reported that he has called [employer], however, can't go to one of those due to need of income to enter the house.  CSW has informed patient that this would likely be a barrier.  Patient did report that he was now aligned with additional referrals to [facility] and [facility] due to limited resources.   He reiterated several times that he wants a change in environment. He again stated that he does not want to go to [facility].  He stated that he is not allowed to come to [facility], the local [employer].  Penni Homans, MSW, LCSW 10/02/2020 3:24 PM

## 2020-10-02 NOTE — Progress Notes (Signed)
Patient reports having anxiety and depression this evening. He rates his symptoms at 6/10. He denies SI HI AVH and pain this morning.  He received and accepted his medication without incident. He has been active on the unit and engages well with his peers. He was encourage to come to staff with any concerns and remains safe with 15 minute safety rounds.    Cleo Butler-Nicholson, LPN

## 2020-10-02 NOTE — Plan of Care (Signed)
  Problem: Group Participation Goal: STG - Patient will engage in groups without prompting or encouragement from LRT x3 group sessions within 5 recreation therapy group sessions Description: STG - Patient will engage in groups without prompting or encouragement from LRT x3 group sessions within 5 recreation therapy group sessions Outcome: Not Progressing   

## 2020-10-02 NOTE — BHH Group Notes (Signed)
BHH Group Notes:  (Nursing/MHT/Case Management/Adjunct)  Date:  10/02/2020  Time:  09:30 AM  Type of Therapy:  Psychoeducational Skills  Participation Level:  Did Not Attend  Moneka Mcquinn A Damira Kem 10/02/2020, 11:20 AM

## 2020-10-02 NOTE — BHH Group Notes (Signed)
LCSW Group Therapy Note  10/02/2020 1:46 PM  Type of Therapy/Topic:  Group Therapy:  Feelings about Diagnosis  Participation Level:  Did Not Attend   Description of Group:   This group will allow patients to explore their thoughts and feelings about diagnoses they have received. Patients will be guided to explore their level of understanding and acceptance of these diagnoses. Facilitator will encourage patients to process their thoughts and feelings about the reactions of others to their diagnosis and will guide patients in identifying ways to discuss their diagnosis with significant others in their lives. This group will be process-oriented, with patients participating in exploration of their own experiences, giving and receiving support, and processing challenge from other group members.   Therapeutic Goals: 1. Patient will demonstrate understanding of diagnosis as evidenced by identifying two or more symptoms of the disorder 2. Patient will be able to express two feelings regarding the diagnosis 3. Patient will demonstrate their ability to communicate their needs through discussion and/or role play  Summary of Patient Progress: X  Therapeutic Modalities:   Cognitive Behavioral Therapy Brief Therapy Feelings Identification   [name] R. Algis Greenhouse, MSW, LCSW, LCAS 10/02/2020 1:46 PM

## 2020-10-02 NOTE — Progress Notes (Signed)
Recreation Therapy Notes  Date: 10/02/2020  Time: 10:00 am   Location: Craft room    Behavioral response: N/A   Intervention Topic: Relaxation   Discussion/Intervention: Patient did not attend group.   Clinical Observations/Feedback:  Patient did not attend group.   Shaverence Outlaw LRT/CTRS        Shaverence  Outlaw 10/02/2020 10:57 AM

## 2020-10-02 NOTE — Plan of Care (Signed)
D- Patient alert and oriented. Patient presents in an irritable mood os assessment. He refused to talk to this writer stating "I don't really care to talk to you".  A- Support and encouragement provided.  Routine safety checks conducted every 15 minutes.  Patient informed to notify staff with problems or concerns.  R- Patient interacts well with other patients on the unit.  Patient remains safe at this time.  Problem: Education: Goal: Knowledge of [facility] Education information/materials will improve Outcome: Not Progressing Goal: Emotional status will improve Outcome: Not Progressing Goal: Mental status will improve Outcome: Not Progressing Goal: Verbalization of understanding the information provided will improve Outcome: Not Progressing   Problem: Health Behavior/Discharge Planning: Goal: Identification of resources available to assist in meeting health care needs will improve Outcome: Not Progressing Goal: Compliance with treatment plan for underlying cause of condition will improve Outcome: Not Progressing   Problem: Activity: Goal: Will identify at least one activity in which they can participate Outcome: Not Progressing   Problem: Coping: Goal: Ability to identify and develop effective coping behavior will improve Outcome: Not Progressing Goal: Ability to interact with others will improve Outcome: Not Progressing Goal: Demonstration of participation in decision-making regarding own care will improve Outcome: Not Progressing   Problem: Self-Concept: Goal: Will verbalize positive feelings about self Outcome: Not Progressing

## 2020-10-02 NOTE — Plan of Care (Signed)
Patient appears more receptive with staff today. Verbalized passive HI towards someone. Denies SI and AVH. No aggressive behaviors noted.Stated that he likes to sleep in the morning.Compliant with medications. Appetite and energy level good. Support and encouragement given.

## 2020-10-02 NOTE — Progress Notes (Addendum)
This writer assumed care of patient at 1300. Patient has not voiced any questions or concerns to this writer at this time. Patient remains safe on the unit.

## 2020-10-02 NOTE — BHH Counselor (Signed)
CSW contacted [facility] HIPPA compliant voicemail left with contact information for follow through.   Vilma Meckel. Algis Greenhouse, MSW, LCSW, LCAS 10/02/2020 2:59 PM
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
## 2020-10-03 DIAGNOSIS — Z765 Malingerer [conscious simulation]: Secondary | ICD-10-CM

## 2020-10-03 NOTE — BHH Counselor (Signed)
CSW called to follow up on referrals:  [facility] Referral has been received but still under review.   [facility] Need to complete phone screening.  Currently moving so wont be until 2nd or 3rd week of April before could be accepted and have bed.    [facility] CSW spoke with Angus Palms about referrals.  Angus Palms reports that a message was left for CSW staff, though no messages are on phone.  Angus Palms reports that at this time he does not know about the status of referral.  CSW reports that she will follow up.  Penni Homans, MSW, LCSW 10/03/2020 10:55 AM

## 2020-10-03 NOTE — Progress Notes (Signed)
Discharge Note:   Pt is alert and oriented to person, place, time and situation upon discharge. Pt was also calm, and cooperative, with discharge process, walked out by MHT to safe transport, his ride. Pt denies suicidal and homicidal ideation, denies hallucinations, denies feelings of depression and anxiety upon discharge. Pt was given his discharge instructions, which include his follow up appointments. Pt verbalized understanding of instructions. All personal belongings were returned to pt upon discharge. Pt voices no complaints, no distress noted, none reported.

## 2020-10-03 NOTE — Progress Notes (Signed)
Recreation Therapy Notes   Date: 10/03/2020  Time: 9:30 am   Location: Craft room    Behavioral response: N/A   Intervention Topic: Creative Expressions   Discussion/Intervention: Patient did not attend group.   Clinical Observations/Feedback:  Patient did not attend group.   [name] LRT/CTRS        Theodore  Alexander 10/03/2020 11:42 AM 

## 2020-10-03 NOTE — BHH Group Notes (Deleted)
BHH Group Notes:  (Nursing/MHT/Case Management/Adjunct)  Date:  10/03/2020  Time:  5:20 AM  Type of Therapy:  wrap up  Participation Level:  Did Not Attend    [name] 10/03/2020, 5:20 AM

## 2020-10-03 NOTE — BHH Group Notes (Signed)
  LCSW Group Therapy Note     10/03/2020 2:06 PM     Type of Therapy/Topic:  Group Therapy:  Emotion Regulation     Participation Level:  Minimal     Description of Group:   The purpose of this group is to assist patients in learning to regulate negative emotions and experience positive emotions. Patients will be guided to discuss ways in which they have been vulnerable to their negative emotions. These vulnerabilities will be juxtaposed with experiences of positive emotions or situations, and patients will be challenged to use positive emotions to combat negative ones. Special emphasis will be placed on coping with negative emotions in conflict situations, and patients will process healthy conflict resolution skills.     Therapeutic Goals:  1.    Patient will identify two positive emotions or experiences to reflect on in order to balance out negative emotions  2.    Patient will label two or more emotions that they find the most difficult to experience  3.    Patient will demonstrate positive conflict resolution skills through discussion and/or role plays     Summary of Patient Progress: Pt was present during the opening session of group but walked out to use the restroom, then was called to the nurses station, and did not return to the session.   Therapeutic Modalities:   Cognitive Behavioral Therapy  Feelings Identification  Dialectical Behavioral Therapy   Enora Trillo [place], MSW, LCSW-A  10/03/2020 2:06 PM

## 2020-10-03 NOTE — BHH Group Notes (Signed)
BHH Group Notes:  (Nursing/MHT/Case Management/Adjunct)  Date:  10/03/2020  Time:  5:25 AM  Type of Therapy:  wrap up  Participation Level:  Active  Participation Quality:  Appropriate  Affect:  Appropriate  Cognitive:  Appropriate  Insight:  Appropriate  Engagement in Group:  Engaged  Modes of Intervention:  Clarification  Summary of Progress/Problems:  [name] 10/03/2020, 5:25 AM

## 2020-10-03 NOTE — Tx Team (Signed)
Interdisciplinary Treatment and Diagnostic Plan Update  10/03/2020 Time of Session: 8:30AM [name] MRN: [id number]  Principal Diagnosis: Bipolar 2 disorder [facility])  Secondary Diagnoses: Principal Problem:   Bipolar 2 disorder (HCC) Active Problems:   Cocaine use disorder, moderate, dependence (HCC)   Cannabis use disorder, moderate, dependence (HCC)   Tobacco use disorder   Amphetamine abuse (HCC)   Current Medications:  Current Facility-Administered Medications  Medication Dose Route Frequency Provider Last Rate Last Admin  . acetaminophen (TYLENOL) tablet 650 mg  650 mg Oral Q6H PRN Jesse Sans, MD      . alum & mag hydroxide-simeth (MAALOX/MYLANTA) 200-200-20 MG/5ML suspension 30 mL  30 mL Oral Q4H PRN Jesse Sans, MD      . ARIPiprazole (ABILIFY) tablet 10 mg  10 mg Oral Daily Jesse Sans, MD   10 mg at 10/02/20 0744  . hydrOXYzine (ATARAX/VISTARIL) tablet 50 mg  50 mg Oral TID PRN Jesse Sans, MD      . loperamide (IMODIUM) capsule 2 mg  2 mg Oral Q4H PRN Jesse Sans, MD      . magnesium hydroxide (MILK OF MAGNESIA) suspension 30 mL  30 mL Oral Daily PRN Jesse Sans, MD      . ondansetron [facility]) tablet 4 mg  4 mg Oral Q8H PRN Jesse Sans, MD      . traZODone (DESYREL) tablet 50 mg  50 mg Oral QHS PRN Jesse Sans, MD   50 mg at 10/01/20 2148  . ziprasidone (GEODON) capsule 20 mg  20 mg Oral Q8H PRN Jesse Sans, MD      . ziprasidone (GEODON) injection 20 mg  20 mg Intramuscular Q6H PRN Jesse Sans, MD       PTA Medications: Medications Prior to Admission  Medication Sig Dispense Refill Last Dose  . divalproex (DEPAKOTE) 500 MG DR tablet Take 1 tablet (500 mg total) by mouth every 12 (twelve) hours. 60 tablet 1   . etodolac (LODINE) 200 MG capsule Take 1 capsule (200 mg total) by mouth every 8 (eight) hours. 12 capsule 0   . prazosin (MINIPRESS) 2 MG capsule Take 1 capsule (2 mg total) by mouth 2 (two) times daily.  60 capsule 1   . zolpidem (AMBIEN) 10 MG tablet Take 1 tablet (10 mg total) by mouth at bedtime. 30 tablet 0     Patient Stressors: Financial difficulties Medication change or noncompliance Substance abuse  Patient Strengths: Average or above average intelligence Communication skills Physical Health  Treatment Modalities: Medication Management, Group therapy, Case management,  1 to 1 session with clinician, Psychoeducation, Recreational therapy.   Physician Treatment Plan for Primary Diagnosis: Bipolar 2 disorder (HCC) Long Term Goal(s): Improvement in symptoms so as ready for discharge Improvement in symptoms so as ready for discharge   Short Term Goals: Ability to identify changes in lifestyle to reduce recurrence of condition will improve Ability to verbalize feelings will improve Ability to disclose and discuss suicidal ideas Ability to demonstrate self-control will improve Ability to identify and develop effective coping behaviors will improve Ability to maintain clinical measurements within normal limits will improve Compliance with prescribed medications will improve Ability to identify triggers associated with substance abuse/mental health issues will improve Ability to identify changes in lifestyle to reduce recurrence of condition will improve Ability to verbalize feelings will improve Ability to disclose and discuss suicidal ideas Ability to demonstrate self-control will improve Ability to identify and develop effective coping  behaviors will improve Ability to maintain clinical measurements within normal limits will improve Compliance with prescribed medications will improve Ability to identify triggers associated with substance abuse/mental health issues will improve  Medication Management: Evaluate patient's response, side effects, and tolerance of medication regimen.  Therapeutic Interventions: 1 to 1 sessions, Unit Group sessions and Medication  administration.  Evaluation of Outcomes: Not Progressing  Physician Treatment Plan for Secondary Diagnosis: Principal Problem:   Bipolar 2 disorder (HCC) Active Problems:   Cocaine use disorder, moderate, dependence (HCC)   Cannabis use disorder, moderate, dependence (HCC)   Tobacco use disorder   Amphetamine abuse (HCC)  Long Term Goal(s): Improvement in symptoms so as ready for discharge Improvement in symptoms so as ready for discharge   Short Term Goals: Ability to identify changes in lifestyle to reduce recurrence of condition will improve Ability to verbalize feelings will improve Ability to disclose and discuss suicidal ideas Ability to demonstrate self-control will improve Ability to identify and develop effective coping behaviors will improve Ability to maintain clinical measurements within normal limits will improve Compliance with prescribed medications will improve Ability to identify triggers associated with substance abuse/mental health issues will improve Ability to identify changes in lifestyle to reduce recurrence of condition will improve Ability to verbalize feelings will improve Ability to disclose and discuss suicidal ideas Ability to demonstrate self-control will improve Ability to identify and develop effective coping behaviors will improve Ability to maintain clinical measurements within normal limits will improve Compliance with prescribed medications will improve Ability to identify triggers associated with substance abuse/mental health issues will improve     Medication Management: Evaluate patient's response, side effects, and tolerance of medication regimen.  Therapeutic Interventions: 1 to 1 sessions, Unit Group sessions and Medication administration.  Evaluation of Outcomes: Not Progressing   RN Treatment Plan for Primary Diagnosis: Bipolar 2 disorder (HCC) Long Term Goal(s): Knowledge of disease and therapeutic regimen to maintain health will  improve  Short Term Goals: Ability to remain free from injury will improve, Ability to verbalize frustration and anger appropriately will improve, Ability to demonstrate self-control, Ability to participate in decision making will improve, Ability to verbalize feelings will improve, Ability to identify and develop effective coping behaviors will improve and Compliance with prescribed medications will improve  Medication Management: RN will administer medications as ordered by provider, will assess and evaluate patient's response and provide education to patient for prescribed medication. RN will report any adverse and/or side effects to prescribing provider.  Therapeutic Interventions: 1 on 1 counseling sessions, Psychoeducation, Medication administration, Evaluate responses to treatment, Monitor vital signs and CBGs as ordered, Perform/monitor CIWA, COWS, AIMS and Fall Risk screenings as ordered, Perform wound care treatments as ordered.  Evaluation of Outcomes: Not Progressing   LCSW Treatment Plan for Primary Diagnosis: Bipolar 2 disorder (HCC) Long Term Goal(s): Safe transition to appropriate next level of care at discharge, Engage patient in therapeutic group addressing interpersonal concerns.  Short Term Goals: Engage patient in aftercare planning with referrals and resources, Increase social support, Increase ability to appropriately verbalize feelings, Increase emotional regulation, Facilitate acceptance of mental health diagnosis and concerns, Facilitate patient progression through stages of change regarding substance use diagnoses and concerns, Identify triggers associated with mental health/substance abuse issues and Increase skills for wellness and recovery  Therapeutic Interventions: Assess for all discharge needs, 1 to 1 time with Social worker, Explore available resources and support systems, Assess for adequacy in community support network, Educate family and significant other(s) on  suicide  prevention, Complete Psychosocial Assessment, Interpersonal group therapy.  Evaluation of Outcomes: Not Progressing   Progress in Treatment: Attending groups: No. Participating in groups: No. Taking medication as prescribed: No. Toleration medication: No. Family/Significant other contact made: No, will contact:  when given permission. Patient understands diagnosis: No. Discussing patient identified problems/goals with staff: No. Medical problems stabilized or resolved: Yes. Denies suicidal/homicidal ideation: Yes. Issues/concerns per patient self-inventory: No. Other: None.  New problem(s) identified: No, Describe:  none.  New Short Term/Long Term Goal(s): detox, elimination of symptoms of psychosis, medication management for mood stabilization; elimination of SI thoughts; development of comprehensive mental wellness/sobriety plan. Update 10/03/20: No changes at this time.  Patient Goals: Pt declined to participate in the treatment team meeting even though invitation was extended by staff. Update 10/03/20: No changes at this time.  Discharge Plan or Barriers: CSW will assist pt with development of an appropriate aftercare/discharge plan. Update 10/03/20: Patient has expressed some interest inpatient substance abuse treatment.   Reason for Continuation of Hospitalization: Medication stabilization Withdrawal symptoms Other; describe psychosis  Estimated Length of Stay: 1-7 days  Attendees: Patient:  10/03/2020 9:43 AM  Physician: Les Pou, MD 10/03/2020 9:43 AM  Nursing: 10/03/2020 9:43 AM  RN Care Manager: 10/03/2020 9:43 AM  Social Worker: Vilma Meckel. Algis Greenhouse, MSW, [place], LCAS 10/03/2020 9:43 AM  Recreational Therapist: 10/03/2020 9:43 AM  Other: Penni Homans, MSW, LCSW 10/03/2020 9:43 AM  Other: Kiva [place], MSW, LCSW-A 10/03/2020 9:43 AM  Other: 10/03/2020 9:43 AM    Scribe for Treatment Team: Glenis Smoker, LCSW 10/03/2020 9:43 AM

## 2020-10-03 NOTE — Progress Notes (Signed)
Recreation Therapy Notes  INPATIENT RECREATION TR PLAN  Patient Details Name: Clever Geraldo MRN: [id number] DOB: Nov 25, 1985 Today's Date: 10/03/2020  Rec Therapy Plan Is patient appropriate for Therapeutic Recreation?: Yes Treatment times per week: at least 3 Estimated Length of Stay: 5-7 days TR Treatment/Interventions: Group participation (Comment)  Discharge Criteria Pt will be discharged from therapy if:: Discharged Treatment plan/goals/alternatives discussed and agreed upon by:: Patient/family  Discharge Summary Short term goals set: Patient will engage in groups without prompting or encouragement from LRT x3 group sessions within 5 recreation therapy group sessions Short term goals met: Not met Reason goals not met: Patient did not attend group. Therapeutic equipment acquired: N/A Reason patient discharged from therapy: Discharge from hospital Pt/family agrees with progress & goals achieved: Yes Date patient discharged from therapy: 10/03/20   Shaverence  Outlaw 10/03/2020, 12:25 PM

## 2020-10-03 NOTE — Plan of Care (Signed)
  Problem: Group Participation Goal: STG - Patient will engage in groups without prompting or encouragement from LRT x3 group sessions within 5 recreation therapy group sessions Description: STG - Patient will engage in groups without prompting or encouragement from LRT x3 group sessions within 5 recreation therapy group sessions 10/03/2020 1224 by Ernest Haber, LRT Outcome: Not Applicable [id number] by Ernest Haber, LRT Outcome: Not Met (add Reason) Note: Patient did not attend group.

## 2020-10-03 NOTE — [facility] Suicide Risk Assessment (Signed)
[facility] Discharge Suicide Risk Assessment   Principal Problem: Malingering Discharge Diagnoses: Principal Problem:   Malingering Active Problems:   Cocaine use disorder, moderate, dependence (HCC)   Cannabis use disorder, moderate, dependence (HCC)   Tobacco use disorder   Amphetamine abuse (HCC)   Total Time spent with patient: 35 minutes- 25 minutes face-to-face contact with patient, 10 minutes documentation, coordination of care, scripts   Musculoskeletal: Strength & Muscle Tone: within normal limits Gait & Station: normal Patient leans: N/A  Psychiatric Specialty Exam: Review of Systems  Constitutional: Negative for appetite change and fatigue.  HENT: Negative for rhinorrhea and sore throat.   Eyes: Negative for photophobia and visual disturbance.  Respiratory: Negative for cough and shortness of breath.   Cardiovascular: Negative for chest pain and palpitations.  Gastrointestinal: Negative for constipation, diarrhea, nausea and vomiting.  Endocrine: Negative for cold intolerance and heat intolerance.  Genitourinary: Negative for difficulty urinating and dysuria.  Musculoskeletal: Negative for arthralgias and myalgias.  Skin: Negative for rash and wound.  Allergic/Immunologic: Negative for food allergies and immunocompromised state.  Neurological: Negative for dizziness and headaches.  Hematological: Negative for adenopathy. Does not bruise/bleed easily.  Psychiatric/Behavioral: Negative for hallucinations, sleep disturbance and suicidal ideas.    Blood pressure 122/82, pulse 92, temperature 98.3 F (36.8 C), temperature source Oral, resp. rate 18, height 6\' 2"  (1.88 m), weight 92.5 kg, SpO2 100 %.Body mass index is 26.19 kg/m.  General Appearance: Casual  Eye Contact::  Minimal  Speech:  Clear and Coherent and Normal Rate  Volume:  Normal  Mood:  Euthymic  Affect:  Congruent  Thought Process:  Coherent  Orientation:  Full (Time, Place, and Person)  Thought Content:   Logical  Suicidal Thoughts:  No  Homicidal Thoughts:  No  Memory:  Immediate;   Fair Recent;   Fair Remote;   Fair  Judgement:  Intact  Insight:  Lacking  Psychomotor Activity:  Normal  Concentration:  Fair  Recall:  [phone] of Knowledge:Fair  Language: Fair  Akathisia:  Negative  Handed:  Right  AIMS (if indicated):     Assets:  Intimacy Physical Health Resilience Social Support Talents/Skills  Sleep:  Number of Hours: 6  Cognition: WNL  ADL's:  Intact   Mental Status Per Nursing Assessment::   On Admission:  NA  Demographic Factors:  Male and Caucasian  Loss Factors: Legal issues  Historical Factors: Impulsivity  Risk Reduction Factors:   Positive social support, Positive therapeutic relationship and Positive coping skills or problem solving skills  Continued Clinical Symptoms:  Alcohol/Substance Abuse/Dependencies Previous Psychiatric Diagnoses and Treatments  Cognitive Features That Contribute To Risk:  None    Suicide Risk:  Minimal: No identifiable suicidal ideation.  Patients presenting with no risk factors but with morbid ruminations; may be classified as minimal risk based on the severity of the depressive symptoms   Follow-up Information    [facility] Follow up.   Specialty: Addiction Medicine Contact information: [address] [place] [phone]        [facility] .   Contact information: [address] [place] [address]        [facility] .   Contact information: [address] [place] [phone]               Plan Of Care/Follow-up recommendations:  Activity:  as tolerated Diet:  regular diet  [phone], MD  10/03/2020, 9:57 AM
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
## 2023-05-18 ENCOUNTER — Encounter (HOSPITAL_COMMUNITY): Payer: Self-pay | Admitting: Nurse Practitioner

## 2023-05-18 ENCOUNTER — Emergency Department (HOSPITAL_COMMUNITY): Payer: MEDICAID

## 2023-05-18 ENCOUNTER — Encounter (HOSPITAL_COMMUNITY): Payer: Self-pay

## 2023-05-18 ENCOUNTER — Other Ambulatory Visit: Payer: Self-pay

## 2023-05-18 ENCOUNTER — Emergency Department (HOSPITAL_COMMUNITY)
Admission: EM | Admit: 2023-05-18 | Discharge: 2023-05-18 | Disposition: A | Discharge status: Other Acute Inpatient Hospital | Payer: MEDICAID | Attending: Emergency Medicine | Admitting: Emergency Medicine

## 2023-05-18 ENCOUNTER — Inpatient Hospital Stay (HOSPITAL_COMMUNITY)
Admission: AD | Admit: 2023-05-18 | Discharge: 2023-05-25 | DRG: 882 | Disposition: A | Discharge status: Home / Self Care | Payer: MEDICAID | Source: Intra-hospital | Attending: Psychiatry | Admitting: Psychiatry

## 2023-05-18 DIAGNOSIS — J45909 Unspecified asthma, uncomplicated: Secondary | ICD-10-CM | POA: Diagnosis present

## 2023-05-18 DIAGNOSIS — F1410 Cocaine abuse, uncomplicated: Secondary | ICD-10-CM | POA: Diagnosis not present

## 2023-05-18 DIAGNOSIS — F1910 Other psychoactive substance abuse, uncomplicated: Secondary | ICD-10-CM | POA: Diagnosis not present

## 2023-05-18 DIAGNOSIS — Z8249 Family history of ischemic heart disease and other diseases of the circulatory system: Secondary | ICD-10-CM | POA: Diagnosis not present

## 2023-05-18 DIAGNOSIS — Z560 Unemployment, unspecified: Secondary | ICD-10-CM | POA: Diagnosis not present

## 2023-05-18 DIAGNOSIS — R079 Chest pain, unspecified: Secondary | ICD-10-CM

## 2023-05-18 DIAGNOSIS — Z9152 Personal history of nonsuicidal self-harm: Secondary | ICD-10-CM

## 2023-05-18 DIAGNOSIS — Z9151 Personal history of suicidal behavior: Secondary | ICD-10-CM | POA: Diagnosis not present

## 2023-05-18 DIAGNOSIS — F411 Generalized anxiety disorder: Secondary | ICD-10-CM | POA: Diagnosis present

## 2023-05-18 DIAGNOSIS — F4323 Adjustment disorder with mixed anxiety and depressed mood: Secondary | ICD-10-CM | POA: Diagnosis present

## 2023-05-18 DIAGNOSIS — Z5900 Homelessness unspecified: Secondary | ICD-10-CM | POA: Diagnosis not present

## 2023-05-18 DIAGNOSIS — Z62810 Personal history of physical and sexual abuse in childhood: Secondary | ICD-10-CM

## 2023-05-18 DIAGNOSIS — F332 Major depressive disorder, recurrent severe without psychotic features: Secondary | ICD-10-CM | POA: Diagnosis present

## 2023-05-18 DIAGNOSIS — F1514 Other stimulant abuse with stimulant-induced mood disorder: Secondary | ICD-10-CM | POA: Diagnosis present

## 2023-05-18 DIAGNOSIS — X58XXXA Exposure to other specified factors, initial encounter: Secondary | ICD-10-CM | POA: Insufficient documentation

## 2023-05-18 DIAGNOSIS — Z825 Family history of asthma and other chronic lower respiratory diseases: Secondary | ICD-10-CM

## 2023-05-18 DIAGNOSIS — F410 Panic disorder [episodic paroxysmal anxiety] without agoraphobia: Secondary | ICD-10-CM | POA: Diagnosis present

## 2023-05-18 DIAGNOSIS — R45851 Suicidal ideations: Secondary | ICD-10-CM

## 2023-05-18 DIAGNOSIS — F1414 Cocaine abuse with cocaine-induced mood disorder: Secondary | ICD-10-CM | POA: Diagnosis present

## 2023-05-18 DIAGNOSIS — F17200 Nicotine dependence, unspecified, uncomplicated: Secondary | ICD-10-CM | POA: Diagnosis present

## 2023-05-18 DIAGNOSIS — N319 Neuromuscular dysfunction of bladder, unspecified: Secondary | ICD-10-CM | POA: Diagnosis present

## 2023-05-18 DIAGNOSIS — F17210 Nicotine dependence, cigarettes, uncomplicated: Secondary | ICD-10-CM | POA: Diagnosis present

## 2023-05-18 DIAGNOSIS — S0502XA Injury of conjunctiva and corneal abrasion without foreign body, left eye, initial encounter: Secondary | ICD-10-CM | POA: Diagnosis present

## 2023-05-18 DIAGNOSIS — E876 Hypokalemia: Secondary | ICD-10-CM | POA: Diagnosis not present

## 2023-05-18 DIAGNOSIS — Z818 Family history of other mental and behavioral disorders: Secondary | ICD-10-CM | POA: Diagnosis not present

## 2023-05-18 DIAGNOSIS — W3400XS Accidental discharge from unspecified firearms or gun, sequela: Secondary | ICD-10-CM

## 2023-05-18 DIAGNOSIS — F4310 Post-traumatic stress disorder, unspecified: Secondary | ICD-10-CM | POA: Diagnosis present

## 2023-05-18 DIAGNOSIS — F319 Bipolar disorder, unspecified: Secondary | ICD-10-CM | POA: Diagnosis present

## 2023-05-18 DIAGNOSIS — F1510 Other stimulant abuse, uncomplicated: Secondary | ICD-10-CM | POA: Insufficient documentation

## 2023-05-18 LAB — CBC
HCT: 38.1 % — ABNORMAL LOW (ref 39.0–52.0)
Hemoglobin: 12.9 g/dL — ABNORMAL LOW (ref 13.0–17.0)
MCH: 29.3 pg (ref 26.0–34.0)
MCHC: 33.9 g/dL (ref 30.0–36.0)
MCV: 86.6 fL (ref 80.0–100.0)
Platelets: 207 10*3/uL (ref 150–400)
RBC: 4.4 MIL/uL (ref 4.22–5.81)
RDW: 12.8 % (ref 11.5–15.5)
WBC: 9.5 10*3/uL (ref 4.0–10.5)
nRBC: 0 % (ref 0.0–0.2)

## 2023-05-18 LAB — TROPONIN I (HIGH SENSITIVITY)
Troponin I (High Sensitivity): 12 ng/L (ref <18)
Troponin I (High Sensitivity): 11 ng/L (ref <18)

## 2023-05-18 LAB — BASIC METABOLIC PANEL
Anion gap: 7 (ref 5–15)
BUN: 12 mg/dL (ref 6–20)
CO2: 27 mmol/L (ref 22–32)
Calcium: 8.9 mg/dL (ref 8.9–10.3)
Chloride: 102 mmol/L (ref 98–111)
Creatinine, Ser: 0.75 mg/dL (ref 0.61–1.24)
GFR, Estimated: >60 mL/min (ref >60)
Glucose, Bld: 92 mg/dL (ref 70–99)
Potassium: 3 mmol/L — ABNORMAL LOW (ref 3.5–5.1)
Sodium: 136 mmol/L (ref 135–145)

## 2023-05-18 MED ORDER — FLUORESCEIN SODIUM 1 MG OP STRP
1.0000 | ORAL_STRIP | Freq: Once | OPHTHALMIC | Status: AC
  Administered 2023-05-18: 1 via OPHTHALMIC
  Filled 2023-05-18: qty 1

## 2023-05-18 MED ORDER — TRAZODONE HCL 50 MG PO TABS
50.0000 mg | ORAL_TABLET | Freq: Every evening | ORAL | Status: DC | PRN
  Administered 2023-05-18 – 2023-05-24 (×7): 50 mg via ORAL
  Filled 2023-05-18 (×7): qty 1

## 2023-05-18 MED ORDER — ACETAMINOPHEN 325 MG PO TABS: 650.0000 mg | ORAL_TABLET | Freq: Four times a day (QID) | ORAL | Status: DC | PRN

## 2023-05-18 MED ORDER — DIPHENHYDRAMINE HCL 25 MG PO CAPS: 50.0000 mg | ORAL_CAPSULE | Freq: Three times a day (TID) | ORAL | Status: DC | PRN

## 2023-05-18 MED ORDER — HYDROXYZINE HCL 25 MG PO TABS
25.0000 mg | ORAL_TABLET | Freq: Three times a day (TID) | ORAL | Status: DC | PRN
Start: 2023-05-18 — End: 2023-05-22
  Administered 2023-05-18 – 2023-05-21 (×5): 25 mg via ORAL
  Filled 2023-05-18 (×5): qty 1

## 2023-05-18 MED ORDER — TETRACAINE HCL 0.5 % OP SOLN
2.0000 [drp] | Freq: Once | OPHTHALMIC | Status: AC
  Administered 2023-05-18: 2 [drp] via OPHTHALMIC
  Filled 2023-05-18: qty 4

## 2023-05-18 MED ORDER — CIPROFLOXACIN HCL 0.3 % OP SOLN
2.0000 [drp] | OPHTHALMIC | Status: DC
  Administered 2023-05-18 – 2023-05-24 (×30): 2 [drp] via OPHTHALMIC
  Filled 2023-05-18: qty 2.5

## 2023-05-18 MED ORDER — ALUM & MAG HYDROXIDE-SIMETH 200-200-20 MG/5ML PO SUSP: 30.0000 mL | ORAL | Status: DC | PRN

## 2023-05-18 MED ORDER — HALOPERIDOL LACTATE 5 MG/ML IJ SOLN: 5.0000 mg | Freq: Three times a day (TID) | INTRAMUSCULAR | Status: DC | PRN

## 2023-05-18 MED ORDER — HALOPERIDOL 5 MG PO TABS: 5.0000 mg | ORAL_TABLET | Freq: Three times a day (TID) | ORAL | Status: DC | PRN

## 2023-05-18 MED ORDER — MAGNESIUM HYDROXIDE 400 MG/5ML PO SUSP: 30.0000 mL | Freq: Every day | ORAL | Status: DC | PRN

## 2023-05-18 MED ORDER — DIPHENHYDRAMINE HCL 50 MG/ML IJ SOLN: 50.0000 mg | Freq: Three times a day (TID) | INTRAMUSCULAR | Status: DC | PRN

## 2023-05-18 MED ORDER — DULOXETINE HCL 30 MG PO CPEP
30.0000 mg | ORAL_CAPSULE | Freq: Every day | ORAL | Status: DC
  Administered 2023-05-18: 30 mg via ORAL
  Filled 2023-05-18: qty 1

## 2023-05-18 MED ORDER — DULOXETINE HCL 30 MG PO CPEP
30.0000 mg | ORAL_CAPSULE | Freq: Every day | ORAL | Status: DC
  Administered 2023-05-19: 30 mg via ORAL
  Filled 2023-05-18 (×3): qty 1

## 2023-05-18 MED ORDER — LORAZEPAM 1 MG PO TABS: 2.0000 mg | ORAL_TABLET | Freq: Three times a day (TID) | ORAL | Status: DC | PRN

## 2023-05-18 MED ORDER — LORAZEPAM 2 MG/ML IJ SOLN: 2.0000 mg | Freq: Three times a day (TID) | INTRAMUSCULAR | Status: DC | PRN

## 2023-05-18 MED ORDER — CIPROFLOXACIN HCL 0.3 % OP SOLN
2.0000 [drp] | OPHTHALMIC | Status: DC
  Administered 2023-05-18 (×2): 2 [drp] via OPHTHALMIC
  Filled 2023-05-18: qty 2.5

## 2023-05-18 MED ORDER — POTASSIUM CHLORIDE CRYS ER 20 MEQ PO TBCR
40.0000 meq | EXTENDED_RELEASE_TABLET | Freq: Once | ORAL | Status: AC
  Administered 2023-05-18: 40 meq via ORAL
  Filled 2023-05-18: qty 2

## 2023-05-18 NOTE — Group Note (Unsigned)
Date:  05/18/2023 Time:  4:28 PM  Group Topic/Focus:  Healthy Communication:   The focus of this group is to discuss communication, barriers to communication, as well as healthy ways to communicate with others.     Participation Level:  {BHH PARTICIPATION WGNFA:21308}  Participation Quality:  {BHH PARTICIPATION QUALITY:22265}  Affect:  {BHH AFFECT:22266}  Cognitive:  {BHH COGNITIVE:22267}  Insight: {BHH Insight2:20797}  Engagement in Group:  {BHH ENGAGEMENT IN MVHQI:69629}  Modes of Intervention:  {BHH MODES OF INTERVENTION:22269}  Additional Comments:  ***  Reymundo Poll 05/18/2023, 4:28 PM
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
## 2023-05-18 NOTE — ED Notes (Signed)
Safe transport here to transport pt to [facility]
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
## 2023-05-18 NOTE — Group Note (Signed)
Date:  05/18/2023 Time:  9:06 PM  Group Topic/Focus:  Wrap-Up Group:   The focus of this group is to help patients review their daily goal of treatment and discuss progress on daily workbooks.    Participation Level:  Did Not Attend  Participation Quality:   N/A  Affect:   N/A  Cognitive:   N/A  Insight: None  Engagement in Group:   N/A  Modes of Intervention:   N/A  Additional Comments:  Patient did not attend wrap up group.   Kennieth Francois 05/18/2023, 9:06 PM
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
## 2023-05-18 NOTE — Discharge Instructions (Addendum)
Apply 2 drops of Cipro to eye solution to your left eye 4 times daily.  Follow-up with ophthalmology, call to schedule an appointment.

## 2023-05-18 NOTE — Progress Notes (Signed)
Pt has been accepted to [facility] on 05/18/2023  pending VS and verbal consent form. Bed assignment: 301-1   Pt meets inpatient criteria per Eligha Bridegroom NP   Attending Physician will be Dr. Ancil Linsey   Report can be called to: Adult unit: [phone]  Pt can arrive after VS and consent form is sent  Care Team Notified: [facility] Rona Ravens RN, Vira Blanco RN, Boyce Medici RN,  Larey Seat RN, Malva Limes RN, Granton Hendra LCSW    [place] Gesselle Fitzsimons LCSW-A   05/18/2023 10:44 AM

## 2023-05-18 NOTE — ED Notes (Addendum)
Secretary to call safe transport for transportation to transfer pt to [facility], pt signed e-signature for voluntary admit to [facility], paper signed for safe transport. Two personal property bags accompany pt.
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
## 2023-05-19 DIAGNOSIS — F411 Generalized anxiety disorder: Secondary | ICD-10-CM | POA: Insufficient documentation

## 2023-05-19 DIAGNOSIS — F1410 Cocaine abuse, uncomplicated: Secondary | ICD-10-CM

## 2023-05-19 DIAGNOSIS — F1510 Other stimulant abuse, uncomplicated: Secondary | ICD-10-CM | POA: Insufficient documentation

## 2023-05-19 DIAGNOSIS — F4310 Post-traumatic stress disorder, unspecified: Secondary | ICD-10-CM | POA: Insufficient documentation

## 2023-05-19 MED ORDER — DULOXETINE HCL 60 MG PO CPEP
60.0000 mg | ORAL_CAPSULE | Freq: Every day | ORAL | Status: DC
  Administered 2023-05-20 – 2023-05-21 (×2): 60 mg via ORAL
  Filled 2023-05-19 (×3): qty 1

## 2023-05-19 NOTE — Group Note (Signed)
Date:  05/19/2023 Time:  9:41 PM  Group Topic/Focus:  Wrap-Up Group:   The focus of this group is to help patients review their daily goal of treatment and discuss progress on daily workbooks.    Participation Level:  Did Not Attend  Participation Quality:   N/A  Affect:   N/A  Cognitive:   N/A  Insight: None  Engagement in Group:   N/A  Modes of Intervention:   N/A  Additional Comments:  Patient did not attend wrap up group.   Kennieth Francois 05/19/2023, 9:41 PM
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
## 2023-05-19 NOTE — Plan of Care (Signed)
  Problem: Safety: Goal: Periods of time without injury will increase Outcome: Progressing   

## 2023-05-19 NOTE — Group Note (Signed)
[facility] LCSW Group Therapy Note   Group Date: 05/19/2023 Start Time: 1055 End Time: 1145  Type of Therapy/Topic:  Group Therapy:  Feelings about Diagnosis  Participation Level:  Did Not Attend   Mood: N/A   Description of Group:    This group will allow patients to explore their thoughts and feelings about diagnoses they have received. Patients will be guided to explore their level of understanding and acceptance of these diagnoses. Facilitator will encourage patients to process their thoughts and feelings about the reactions of others to their diagnosis, and will guide patients in identifying ways to discuss their diagnosis with significant others in their lives. This group will be process-oriented, with patients participating in exploration of their own experiences as well as giving and receiving support and challenge from other group members.   Therapeutic Goals: 1. Patient will demonstrate understanding of diagnosis as evidence by identifying two or more symptoms of the disorder:  2. Patient will be able to express two feelings regarding the diagnosis 3. Patient will demonstrate ability to communicate their needs through discussion and/or role plays.    Therapeutic Modalities:   Cognitive Behavioral Therapy Brief Therapy Feelings Identification    Mishal Probert S Loucille Takach, LCSW

## 2023-05-19 NOTE — Progress Notes (Signed)
   05/18/23 2140  Psych Admission Type (Psych Patients Only)  Admission Status Voluntary  Psychosocial Assessment  Patient Complaints Anxiety;Hopelessness;Worrying  Eye Contact Brief  Facial Expression Flat  Affect Depressed  Speech Logical/coherent  Interaction Assertive  Motor Activity Other (Comment) (WNL)  Appearance/Hygiene In scrubs  Behavior Characteristics Cooperative  Mood Anxious  Thought Process  Coherency WDL  Content WDL  Delusions None reported or observed  Perception WDL  Hallucination None reported or observed  Judgment Impaired  Confusion None  Danger to Self  Current suicidal ideation? Passive  Description of Suicide Plan no plan  Self-Injurious Behavior No self-injurious ideation or behavior indicators observed or expressed   Agreement Not to Harm Self Yes  Description of Agreement verbal  Danger to Others  Danger to Others None reported or observed

## 2023-05-19 NOTE — Group Note (Signed)
Recreation Therapy Group Note   Group Topic:Animal Assisted Therapy   Group Date: 05/19/2023 Start Time: 0945 End Time: 1030 Facilitators: Domanik Rainville-McCall, LRT,CTRS Location: [address] Dayroom   Animal-Assisted Activity (AAA) Program Checklist/Progress Notes Patient Eligibility Criteria Checklist & Daily Group note for Rec Tx Intervention  AAA/T Program Assumption of Risk Form signed by Patient/ or Parent Legal Guardian Yes  Patient understands his/her participation is voluntary Yes  Education: Charity fundraiser, Appropriate Animal Interaction   Education Outcome: Acknowledges education.    Clinical Observations/Individualized Feedback: Pet therapy did not occur today due to therapy dog being under the weather. Pet therapy with resume next week at original time.    Plan: Continue to engage patient in RT group sessions 2-3x/week.   Jessicaann Overbaugh-McCall, LRT,CTRS 05/19/2023 12:24 PM

## 2023-05-19 NOTE — Progress Notes (Signed)
   05/19/23 0557  15 Minute Checks  Location Bedroom  Visual Appearance Calm  Behavior Sleeping  Sleep (Behavioral Health Patients Only)  Calculate sleep? (Click Yes once per 24 hr at 0600 safety check) Yes  Documented sleep last 24 hours 8
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
## 2023-05-19 NOTE — Progress Notes (Signed)
   05/19/23 1300  Psych Admission Type (Psych Patients Only)  Admission Status Voluntary  Psychosocial Assessment  Patient Complaints Anxiety  Eye Contact Brief  Facial Expression Animated  Affect Depressed  Speech Logical/coherent  Interaction Assertive  Motor Activity Other (Comment) (WNL)  Appearance/Hygiene Unremarkable  Behavior Characteristics Cooperative  Mood Anxious  Thought Process  Coherency WDL  Content WDL  Delusions None reported or observed  Perception WDL  Hallucination None reported or observed  Judgment Impaired  Confusion None  Danger to Self  Current suicidal ideation? Denies  Self-Injurious Behavior No self-injurious ideation or behavior indicators observed or expressed   Agreement Not to Harm Self Yes  Description of Agreement verbal  Danger to Others  Danger to Others None reported or observed
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
## 2023-05-19 NOTE — Progress Notes (Signed)
     05/19/2023       12:21 PM   [name]   Type of Note: [facility] Acceptance  Spoke to admissions for [facility] who could not provide this CSW much information due to pt not being present. CSW was told that if pt wants to move forward with getting treatment at their facility that could happen. Pt would not have to redo the phone assessment but needs to contact facility to proceed. CSW will continue to assist after reaching out to parole officer.   Signed:  Fallen Crisostomo, LCSW-A 05/19/2023  12:21 PM

## 2023-05-20 ENCOUNTER — Encounter (HOSPITAL_COMMUNITY): Payer: Self-pay

## 2023-05-20 DIAGNOSIS — F1410 Cocaine abuse, uncomplicated: Secondary | ICD-10-CM | POA: Diagnosis not present

## 2023-05-20 LAB — HEPATITIS PANEL, ACUTE
HCV Ab: REACTIVE — AB
Hep A IgM: NONREACTIVE
Hep B C IgM: NONREACTIVE
Hepatitis B Surface Ag: NONREACTIVE

## 2023-05-20 LAB — BASIC METABOLIC PANEL
Anion gap: 7 (ref 5–15)
BUN: 15 mg/dL (ref 6–20)
CO2: 26 mmol/L (ref 22–32)
Calcium: 8.8 mg/dL — ABNORMAL LOW (ref 8.9–10.3)
Chloride: 105 mmol/L (ref 98–111)
Creatinine, Ser: 0.7 mg/dL (ref 0.61–1.24)
GFR, Estimated: >60 mL/min (ref >60)
Glucose, Bld: 94 mg/dL (ref 70–99)
Potassium: 4.3 mmol/L (ref 3.5–5.1)
Sodium: 138 mmol/L (ref 135–145)

## 2023-05-20 LAB — TSH: TSH: 0.907 u[IU]/mL (ref 0.350–4.500)

## 2023-05-20 LAB — HIV ANTIBODY (ROUTINE TESTING W REFLEX): HIV Screen 4th Generation wRfx: NONREACTIVE

## 2023-05-20 NOTE — Group Note (Signed)
Recreation Therapy Group Note   Group Topic:Other  Group Date: 05/20/2023 Start Time: 0920 End Time: 1012 Facilitators: Imaad Reuss-McCall, LRT,CTRS Location: [address] Dayroom   Group Topic: Self-Expression  Goal Area(s) Addresses:  Patient will successfully identify positive attributes about themselves.  Patient will identify healthy ways to express feelings. Patient will acknowledge benefit(s) of improved self-expression.   Activity: Quarry manager. Patients were given card stock paper and individual watercolor sets. Patients were to use the supplies given to create pictures express what they are feeling or thinking.  Education: Self-Expression Discharge Planning  Education Outcome: Acknowledges education/In group clarification offered/Needs additional education   Affect/Mood: N/A   Participation Level: Did not attend    Clinical Observations/Individualized Feedback:    Plan: Continue to engage patient in RT group sessions 2-3x/week.   Desha Bitner-McCall, LRT,CTRS 05/20/2023 12:48 PM

## 2023-05-20 NOTE — Progress Notes (Signed)
   05/20/23 2258  Psych Admission Type (Psych Patients Only)  Admission Status Voluntary  Psychosocial Assessment  Patient Complaints Anxiety  Eye Contact Brief  Facial Expression Flat  Affect Depressed  Speech Logical/coherent  Interaction Assertive  Motor Activity Slow  Appearance/Hygiene Unremarkable  Behavior Characteristics Cooperative  Mood Depressed  Thought Process  Coherency WDL  Content WDL  Delusions None reported or observed  Perception WDL  Hallucination None reported or observed  Judgment Impaired  Confusion None  Danger to Self  Current suicidal ideation? Denies  Self-Injurious Behavior No self-injurious ideation or behavior indicators observed or expressed   Agreement Not to Harm Self Yes  Description of Agreement verbal  Danger to Others  Danger to Others None reported or observed

## 2023-05-20 NOTE — Progress Notes (Signed)
   05/20/23 0600  15 Minute Checks  Location Bedroom  Visual Appearance Calm  Behavior Sleeping  Sleep (Behavioral Health Patients Only)  Calculate sleep? (Click Yes once per 24 hr at 0600 safety check) Yes  Documented sleep last 24 hours 10

## 2023-05-20 NOTE — Progress Notes (Signed)
   05/19/23 2136  Psych Admission Type (Psych Patients Only)  Admission Status Voluntary  Psychosocial Assessment  Patient Complaints Anxiety  Eye Contact Brief  Facial Expression Flat  Affect Depressed  Speech Logical/coherent  Interaction Assertive  Motor Activity Other (Comment) (WNL)  Appearance/Hygiene In scrubs  Behavior Characteristics Cooperative  Mood Anxious  Thought Process  Coherency WDL  Content WDL  Delusions None reported or observed  Perception WDL  Hallucination None reported or observed  Judgment Impaired  Confusion None  Danger to Self  Current suicidal ideation? Denies  Self-Injurious Behavior No self-injurious ideation or behavior indicators observed or expressed   Agreement Not to Harm Self Yes  Description of Agreement verbal  Danger to Others  Danger to Others None reported or observed

## 2023-05-20 NOTE — BHH Group Notes (Signed)
Spiritual care group facilitated by Chaplain Katy Denessa Cavan, BCC  Group focused on topic of strength. Group members reflected on what thoughts and feelings emerge when they hear this topic. They then engaged in facilitated dialog around how strength is present in their lives. This dialog focused on representing what strength had been to them in their lives (images and patterns given) and what they saw as helpful in their life now (what they needed / wanted).  Activity drew on narrative framework.  Patient Progress: Did not attend.  

## 2023-05-20 NOTE — Progress Notes (Signed)
     05/20/2023       11:16 AM   [name]   Type of Note: Training and development officer  Pt gave CSW permission to Therapist, sports with [employer], [employer] [phone]. Parole officer has to give the OK for pt to go to [facility] for treatment at d/c. Per pt report, pt has been attempting to get the OK for the last 3 months which is when he was first accepted to [facility].   CSW attempted to contact Officer McCray 05/20/23 @ 11:15AM with no response. Attempted to LVM but mailbox was full. CSW will continue to assist.   Signed:  Arian Mcquitty, LCSW-A 05/20/2023  11:16 AM
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
## 2023-05-20 NOTE — BH IP Treatment Plan (Signed)
Interdisciplinary Treatment and Diagnostic Plan Initial  05/20/2023 Time of Session: [address] MRN: [id number]  Principal Diagnosis: Cocaine abuse [facility])  Secondary Diagnoses: Principal Problem:   Cocaine abuse (HCC) Active Problems:   Tobacco use disorder   MDD (major depressive disorder), recurrent episode, severe (HCC)   Methamphetamine use (HCC)   GAD (generalized anxiety disorder)   PTSD (post-traumatic stress disorder)   Current Medications:  Current Facility-Administered Medications  Medication Dose Route Frequency Provider Last Rate Last Admin   acetaminophen (TYLENOL) tablet 650 mg  650 mg Oral Q6H PRN Eligha Bridegroom, NP       alum & mag hydroxide-simeth (MAALOX/MYLANTA) 200-200-20 MG/5ML suspension 30 mL  30 mL Oral Q4H PRN Eligha Bridegroom, NP       ciprofloxacin (CILOXAN) 0.3 % ophthalmic solution 2 drop  2 drop Left Eye Q4H while awake Eligha Bridegroom, NP   2 drop at 05/20/23 1009   diphenhydrAMINE (BENADRYL) capsule 50 mg  50 mg Oral TID PRN Eligha Bridegroom, NP       Or   diphenhydrAMINE (BENADRYL) injection 50 mg  50 mg Intramuscular TID PRN Eligha Bridegroom, NP       DULoxetine (CYMBALTA) DR capsule 60 mg  60 mg Oral Daily Kizzie Ide B, MD   60 mg at 05/20/23 6962   haloperidol (HALDOL) tablet 5 mg  5 mg Oral TID PRN Eligha Bridegroom, NP       Or   haloperidol lactate (HALDOL) injection 5 mg  5 mg Intramuscular TID PRN Eligha Bridegroom, NP       hydrOXYzine (ATARAX) tablet 25 mg  25 mg Oral TID PRN Eligha Bridegroom, NP   25 mg at 05/19/23 2136   LORazepam (ATIVAN) tablet 2 mg  2 mg Oral TID PRN Eligha Bridegroom, NP       Or   LORazepam (ATIVAN) injection 2 mg  2 mg Intramuscular TID PRN Eligha Bridegroom, NP       magnesium hydroxide (MILK OF MAGNESIA) suspension 30 mL  30 mL Oral Daily PRN Eligha Bridegroom, NP       traZODone (DESYREL) tablet 50 mg  50 mg Oral QHS PRN Eligha Bridegroom, NP   50 mg at 05/19/23 2136   PTA Medications: No  medications prior to admission.    Patient Stressors:    Patient Strengths:    Treatment Modalities: Medication Management, Group therapy, Case management,  1 to 1 session with clinician, Psychoeducation, Recreational therapy.   Physician Treatment Plan for Primary Diagnosis: Cocaine abuse (HCC) Long Term Goal(s):     Short Term Goals: Ability to identify changes in lifestyle to reduce recurrence of condition will improve Ability to verbalize feelings will improve Ability to disclose and discuss suicidal ideas Ability to demonstrate self-control will improve Ability to identify and develop effective coping behaviors will improve Ability to maintain clinical measurements within normal limits will improve Compliance with prescribed medications will improve Ability to identify triggers associated with substance abuse/mental health issues will improve  Medication Management: Evaluate patient's response, side effects, and tolerance of medication regimen.  Therapeutic Interventions: 1 to 1 sessions, Unit Group sessions and Medication administration.  Evaluation of Outcomes: Progressing  Physician Treatment Plan for Secondary Diagnosis: Principal Problem:   Cocaine abuse (HCC) Active Problems:   Tobacco use disorder   MDD (major depressive disorder), recurrent episode, severe (HCC)   Methamphetamine use (HCC)   GAD (generalized anxiety disorder)   PTSD (post-traumatic stress disorder)  Long Term Goal(s):  Short Term Goals: Ability to identify changes in lifestyle to reduce recurrence of condition will improve Ability to verbalize feelings will improve Ability to disclose and discuss suicidal ideas Ability to demonstrate self-control will improve Ability to identify and develop effective coping behaviors will improve Ability to maintain clinical measurements within normal limits will improve Compliance with prescribed medications will improve Ability to identify triggers  associated with substance abuse/mental health issues will improve     Medication Management: Evaluate patient's response, side effects, and tolerance of medication regimen.  Therapeutic Interventions: 1 to 1 sessions, Unit Group sessions and Medication administration.  Evaluation of Outcomes: Progressing   RN Treatment Plan for Primary Diagnosis: Cocaine abuse (HCC) Long Term Goal(s): Knowledge of disease and therapeutic regimen to maintain health will improve  Short Term Goals: Ability to remain free from injury will improve, Ability to verbalize frustration and anger appropriately will improve, Ability to demonstrate self-control, Ability to participate in decision making will improve, Ability to verbalize feelings will improve, Ability to disclose and discuss suicidal ideas, Ability to identify and develop effective coping behaviors will improve, and Compliance with prescribed medications will improve  Medication Management: RN will administer medications as ordered by provider, will assess and evaluate patient's response and provide education to patient for prescribed medication. RN will report any adverse and/or side effects to prescribing provider.  Therapeutic Interventions: 1 on 1 counseling sessions, Psychoeducation, Medication administration, Evaluate responses to treatment, Monitor vital signs and CBGs as ordered, Perform/monitor CIWA, COWS, AIMS and Fall Risk screenings as ordered, Perform wound care treatments as ordered.  Evaluation of Outcomes: Progressing   LCSW Treatment Plan for Primary Diagnosis: Cocaine abuse (HCC) Long Term Goal(s): Safe transition to appropriate next level of care at discharge, Engage patient in therapeutic group addressing interpersonal concerns.  Short Term Goals: Engage patient in aftercare planning with referrals and resources, Increase social support, Increase ability to appropriately verbalize feelings, Increase emotional regulation, Facilitate  acceptance of mental health diagnosis and concerns, Facilitate patient progression through stages of change regarding substance use diagnoses and concerns, Identify triggers associated with mental health/substance abuse issues, and Increase skills for wellness and recovery  Therapeutic Interventions: Assess for all discharge needs, 1 to 1 time with Social worker, Explore available resources and support systems, Assess for adequacy in community support network, Educate family and significant other(s) on suicide prevention, Complete Psychosocial Assessment, Interpersonal group therapy.  Evaluation of Outcomes: Progressing   Progress in Treatment: Attending groups: Yes. Participating in groups: Yes. Taking medication as prescribed: Yes. Toleration medication: Yes. Family/Significant other contact made: No, will contact:  PT REFUSED   was given the ok to contact parole officer McCray [phone]) with [employer].  Patient understands diagnosis: Yes. Discussing patient identified problems/goals with staff: Yes. Medical problems stabilized or resolved: Yes. Denies suicidal/homicidal ideation: Yes. Issues/concerns per patient self-inventory: Yes. Other: N/A  New problem(s) identified: No, Describe:  None reported  New Short Term/Long Term Goal(s):stabilization, elimination of SI thoughts, development of comprehensive mental wellness plan.   Patient Goals:  Medication Stabilization  Discharge Plan or Barriers: CSW will continue to follow and assess for appropriate referrals and possible discharge planning.   Reason for Continuation of Hospitalization: Anxiety Depression Medication stabilization Suicidal ideation Withdrawal symptoms  Estimated Length of Stay: 5-7 Days  Last 3 [place] Suicide Severity Risk Score: Flowsheet Row [facility] Most recent reading at 05/18/2023  2:00 PM ED from 05/18/2023 in [facility]  Emergency Department  at [facility] Most recent reading at 05/18/2023  4:56 AM Admission (Discharged) from 09/27/2020 in [facility] Most recent reading at 09/27/2020  4:56 PM  C-SSRS RISK CATEGORY No Risk No Risk Low Risk       Last PHQ 2/9 Scores:     No data to display         detox, medication management for mood stabilization; elimination of SI thoughts; development of comprehensive mental wellness/sobriety plan   Scribe for Treatment Team: Ane Payment, LCSW 05/20/2023 12:14 PM

## 2023-05-20 NOTE — Plan of Care (Signed)
  Problem: Education: Goal: Emotional status will improve Outcome: Progressing Goal: Mental status will improve Outcome: Progressing   

## 2023-05-20 NOTE — Progress Notes (Addendum)
Pt denied SI/HI/AVH this morning. Pt has been isolative/ guarded throughout the day. Pt was irritable this morning when asked routine questions by this RN, reporting "I just answered these questions for the doctor". Pt requests to speak with his social worker about if they were able to contact his Civil [employer] fast streamer, Child psychotherapist notified. Pt given scheduled medications as prescribed. Q15 min checks verified for safety. Patient verbally contracts for safety. Patient compliant with medications and treatment plan. Pt is safe on the unit.   05/20/23 0859  Psych Admission Type (Psych Patients Only)  Admission Status Voluntary  Psychosocial Assessment  Patient Complaints Irritability  Eye Contact Brief  Facial Expression Flat  Affect Depressed  Speech Logical/coherent;Soft  Interaction Assertive  Motor Activity Slow  Appearance/Hygiene Unremarkable  Behavior Characteristics Irritable  Mood Depressed;Helpless  Thought Process  Coherency WDL  Content WDL  Delusions None reported or observed  Perception WDL  Hallucination None reported or observed  Judgment Impaired  Confusion None  Danger to Self  Current suicidal ideation? Denies  Description of Suicide Plan No plan  Self-Injurious Behavior No self-injurious ideation or behavior indicators observed or expressed   Agreement Not to Harm Self Yes  Description of Agreement Verbal  Danger to Others  Danger to Others None reported or observed

## 2023-05-20 NOTE — Plan of Care (Signed)
  Problem: Activity: Goal: Interest or engagement in activities will improve Outcome: Progressing   Problem: Safety: Goal: Periods of time without injury will increase Outcome: Progressing   

## 2023-05-21 ENCOUNTER — Inpatient Hospital Stay (HOSPITAL_COMMUNITY)
Admit: 2023-05-21 | Discharge: 2023-05-21 | Disposition: A | Discharge status: Home / Self Care | Payer: MEDICAID | Attending: Psychiatry | Admitting: Psychiatry

## 2023-05-21 DIAGNOSIS — F1410 Cocaine abuse, uncomplicated: Secondary | ICD-10-CM | POA: Diagnosis not present

## 2023-05-21 MED ORDER — DULOXETINE HCL 30 MG PO CPEP
90.0000 mg | ORAL_CAPSULE | Freq: Every day | ORAL | Status: DC
  Administered 2023-05-22 – 2023-05-23 (×2): 90 mg via ORAL
  Filled 2023-05-21 (×5): qty 3

## 2023-05-21 NOTE — Progress Notes (Signed)
   05/21/23 1000  Psych Admission Type (Psych Patients Only)  Admission Status Voluntary  Psychosocial Assessment  Patient Complaints Anxiety  Eye Contact Brief  Facial Expression Flat  Affect Anxious;Depressed  Speech Logical/coherent  Interaction Assertive  Motor Activity Slow  Appearance/Hygiene Unremarkable  Behavior Characteristics Cooperative  Mood Anxious;Depressed  Thought Process  Coherency WDL  Content WDL  Delusions None reported or observed  Perception WDL  Hallucination None reported or observed  Judgment Impaired  Confusion None  Danger to Self  Current suicidal ideation? Denies  Description of Suicide Plan No plan  Self-Injurious Behavior No self-injurious ideation or behavior indicators observed or expressed   Agreement Not to Harm Self Yes  Description of Agreement Verbal  Danger to Others  Danger to Others None reported or observed

## 2023-05-21 NOTE — Progress Notes (Addendum)
     05/21/2023       9:53 AM   [name]   Type of Note: Probation Officer   Got in contact with pt's parole officer, [employer] officer Theresa Mulligan [phone]) per patients request to inform officer of his whereabouts. Officer McRae requested an email be sent by this CSW (to [email or web address] stating that pt is in [facility] for her documentation purposes. CSW will obtain consent from pt and send email today.   CSW inquired about pt's ability to d/c to [facility] for inpatient care at d/c and Officer Theresa Mulligan stated a phone call would need to be made to Elenore Paddy (#[phone]) who works in [facility] probation as she is pt's "actualPrintmaker. Officer Theresa Mulligan states that she is "just his Hydrologist since he relocated to [place]." CSW will get consent from pt to contact and will continue to assist.  Pt gave consent to send email and make additional phone call to [employer]. Pt also consented to calling Brianna Alston's supervisor to get in contact with someone. Attempted to call two different numbers for Elenore Paddy with no response. VM full. CSW will continue to assist.    Signed:  Petrona Wyeth, LCSW-A 05/21/2023  9:53 AM

## 2023-05-21 NOTE — Plan of Care (Signed)
  Problem: Education: Goal: Knowledge of [facility] Education information/materials will improve Outcome: Progressing Goal: Emotional status will improve Outcome: Progressing Goal: Mental status will improve Outcome: Progressing Goal: Verbalization of understanding the information provided will improve Outcome: Progressing   

## 2023-05-21 NOTE — Plan of Care (Signed)
D- Patient alert and oriented. Pt endorses anxiety 5/10 and difficulty sleeping.  Denies SI, HI, AVH, and pain.  A- Scheduled medications administered to patient, per MD orders. PRNs given for anxiety and insomnia. Support and encouragement provided.  Routine safety checks conducted every 15 minutes.  Patient informed to notify staff with problems or concerns. R- No adverse drug reactions noted. Patient contracts for safety at this time. Patient compliant with medications and treatment plan. Patient receptive, calm, and cooperative. Limited interaction with staff and peers.  Patient remains safe at this time.  Problem: Education: Goal: Mental status will improve Outcome: Progressing Goal: Verbalization of understanding the information provided will improve Outcome: Progressing   Problem: Activity: Goal: Interest or engagement in activities will improve Outcome: Progressing Goal: Sleeping patterns will improve Outcome: Progressing
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
## 2023-05-21 NOTE — Group Note (Signed)
LCSW Group Therapy Note   Group Date: 05/21/2023 Start Time: 1100 End Time: 1200   Type of Therapy and Topic:  Group Therapy: Boundaries  Participation Level:  Did Not Attend  Description of Group: This group will address the use of boundaries in their personal lives. Patients will explore why boundaries are important, the difference between healthy and unhealthy boundaries, and negative and postive outcomes of different boundaries and will look at how boundaries can be crossed.  Patients will be encouraged to identify current boundaries in their own lives and identify what kind of boundary is being set. Facilitators will guide patients in utilizing problem-solving interventions to address and correct types boundaries being used and to address when no boundary is being used. Understanding and applying boundaries will be explored and addressed for obtaining and maintaining a balanced life. Patients will be encouraged to explore ways to assertively make their boundaries and needs known to significant others in their lives, using other group members and facilitator for role play, support, and feedback.  Therapeutic Goals:  1.  Patient will identify areas in their life where setting clear boundaries could be  used to improve their life.  2.  Patient will identify signs/triggers that a boundary is not being respected. 3.  Patient will identify two ways to set boundaries in order to achieve balance in  their lives: 4.  Patient will demonstrate ability to communicate their needs and set boundaries  through discussion and/or role plays  Summary of Patient Progress:  Pt did not attend group  Therapeutic Modalities:   Cognitive Behavioral Therapy Solution-Focused Therapy  Kathi Der, LCSWA 05/21/2023  12:24 PM

## 2023-05-21 NOTE — Plan of Care (Signed)
  Problem: Education: Goal: Emotional status will improve Outcome: Progressing Goal: Mental status will improve Outcome: Progressing   

## 2023-05-22 DIAGNOSIS — F1410 Cocaine abuse, uncomplicated: Secondary | ICD-10-CM | POA: Diagnosis not present

## 2023-05-22 MED ORDER — HYDROXYZINE HCL 50 MG PO TABS
50.0000 mg | ORAL_TABLET | Freq: Three times a day (TID) | ORAL | Status: DC | PRN
Start: 2023-05-22 — End: 2023-05-25
  Administered 2023-05-22 – 2023-05-24 (×4): 50 mg via ORAL
  Filled 2023-05-22 (×3): qty 1

## 2023-05-22 NOTE — [facility] Group Notes (Signed)
Adult Psychoeducational Group Note  Date:  05/22/2023 Time:  9:29 PM  Group Topic/Focus:  Wrap-Up Group:   The focus of this group is to help patients review their daily goal of treatment and discuss progress on daily workbooks.  Participation Level:  Active  Participation Quality:  Attentive  Affect:  Appropriate  Cognitive:  Alert  Insight: Improving  Engagement in Group:  Engaged  Modes of Intervention:  Discussion  Additional Comments:  Pt attended and participated in AA group.  Maura Crandall Cassandra 05/22/2023, 9:29 PM

## 2023-05-22 NOTE — Progress Notes (Signed)
     05/22/2023       2:36 PM   [name]   Type of Note: [facility]  Made final call to [facility] today, pt reviewed assessment with [facility] intake staff and was approved to [facility] to the [facility] on Monday around 12:30-1:00PM. Pt agreeable to d/c plan and email regarding pts parole status has been received by [facility].    Signed:  Afton Mikelson, LCSW-A 05/22/2023  2:36 PM
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
## 2023-05-22 NOTE — Progress Notes (Signed)
   05/22/23 0552  15 Minute Checks  Location Bedroom  Visual Appearance Calm  Behavior Sleeping  Sleep (Behavioral Health Patients Only)  Calculate sleep? (Click Yes once per 24 hr at 0600 safety check) Yes  Documented sleep last 24 hours 8

## 2023-05-22 NOTE — Progress Notes (Signed)
   05/22/23 1600  Psych Admission Type (Psych Patients Only)  Admission Status Voluntary  Psychosocial Assessment  Patient Complaints Anxiety  Eye Contact Brief  Facial Expression Flat  Affect Anxious  Speech Logical/coherent  Interaction Assertive  Motor Activity Slow  Appearance/Hygiene Unremarkable  Behavior Characteristics Cooperative  Mood Anxious  Thought Process  Coherency WDL  Content WDL  Delusions None reported or observed  Perception WDL  Hallucination None reported or observed  Judgment Impaired  Confusion None  Danger to Self  Current suicidal ideation? Denies  Self-Injurious Behavior No self-injurious ideation or behavior indicators observed or expressed   Agreement Not to Harm Self Yes  Description of Agreement verbal  Danger to Others  Danger to Others None reported or observed

## 2023-05-22 NOTE — Group Note (Signed)
Recreation Therapy Group Note   Group Topic:Stress Management  Group Date: 05/22/2023 Start Time: 0930 End Time: 0955 Facilitators: Yina Riviere-McCall, LRT,CTRS Location: [address] Dayroom   Group Topic: Stress Management  Goal Area(s) Addresses:  Patient will identify positive stress management techniques. Patient will identify benefits of using stress management post d/c.  Activity: Meditation. LRT explained to patients the essence of meditation and to get as relaxed as possible. LRT played a meditation that guided patients to identify things that are holding them back from reaching the goals they see for themselves.   Education:  Stress Management, Discharge Planning.   Education Outcome: Acknowledges Education   Affect/Mood: N/A   Participation Level: Did not attend    Clinical Observations/Individualized Feedback:     Plan: Continue to engage patient in RT group sessions 2-3x/week.   Marco Adelson-McCall, LRT,CTRS  05/22/2023 12:30 PM

## 2023-05-23 DIAGNOSIS — F1410 Cocaine abuse, uncomplicated: Secondary | ICD-10-CM | POA: Diagnosis not present

## 2023-05-23 NOTE — Progress Notes (Signed)
   05/23/23 0601  15 Minute Checks  Location Bedroom  Visual Appearance Calm  Behavior Sleeping  Sleep (Behavioral Health Patients Only)  Calculate sleep? (Click Yes once per 24 hr at 0600 safety check) Yes  Documented sleep last 24 hours 6.75

## 2023-05-23 NOTE — Plan of Care (Signed)
  Problem: Education: Goal: Knowledge of [place] [facility] Education information/materials will improve Outcome: Progressing Goal: Emotional status will improve Outcome: Progressing Goal: Mental status will improve Outcome: Progressing   
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
## 2023-05-23 NOTE — Progress Notes (Signed)
   05/22/23 2124  Psych Admission Type (Psych Patients Only)  Admission Status Voluntary  Psychosocial Assessment  Patient Complaints Anxiety  Eye Contact Fair  Facial Expression Flat  Affect Anxious  Speech Logical/coherent  Interaction Assertive  Motor Activity Slow  Appearance/Hygiene Unremarkable  Behavior Characteristics Cooperative  Mood Anxious  Thought Process  Coherency WDL  Content WDL  Delusions None reported or observed  Perception WDL  Hallucination None reported or observed  Judgment Impaired  Confusion None  Danger to Self  Current suicidal ideation? Denies  Self-Injurious Behavior No self-injurious ideation or behavior indicators observed or expressed   Agreement Not to Harm Self Yes  Description of Agreement verbal  Danger to Others  Danger to Others None reported or observed

## 2023-05-23 NOTE — [facility] Group Notes (Signed)
[facility] Group Notes:  (Nursing/MHT/Case Management/Adjunct)  Date:  05/23/2023  Time:  9:09 PM  Type of Therapy:   Wrap-up group  Participation Level:  Active  Participation Quality:  Appropriate  Affect:  Appropriate  Cognitive:  Appropriate  Insight:  Appropriate  Engagement in Group:  Engaged  Modes of Intervention:  Education  Summary of Progress/Problems: Pt goal to not be aggravated by others. Met goal. Rated his day 8/10.  [name] 05/23/2023, 9:09 PM

## 2023-05-23 NOTE — [facility] Group Notes (Signed)
[facility] Group Notes:  (Nursing)  Date:  05/23/2023  Time: 1600  Type of Therapy:  Nurse Education  Participation Level:  Active  Participation Quality:  Attentive  Affect:  Appropriate  Cognitive:  Alert and Appropriate  Insight:  Appropriate and Good  Engagement in Group:  Engaged and Supportive  Modes of Intervention:  Discussion, Exploration, Rapport Building, Socialization, and Support  Summary of Progress/Problems:  [name] 05/23/2023, 6:56 PM

## 2023-05-23 NOTE — Progress Notes (Signed)
   05/23/23 1100  Psych Admission Type (Psych Patients Only)  Admission Status Voluntary  Psychosocial Assessment  Patient Complaints Anxiety  Eye Contact Fair  Facial Expression Anxious  Affect Anxious  Speech Logical/coherent  Interaction Assertive  Motor Activity Slow  Appearance/Hygiene Unremarkable  Behavior Characteristics Cooperative  Mood Anxious  Thought Process  Coherency WDL  Content WDL  Delusions None reported or observed  Perception WDL  Hallucination None reported or observed  Judgment Impaired  Confusion None  Danger to Self  Current suicidal ideation? Denies  Self-Injurious Behavior No self-injurious ideation or behavior indicators observed or expressed   Agreement Not to Harm Self Yes  Description of Agreement agreed to contact staff before acting on harmful thoughts  Danger to Others  Danger to Others None reported or observed

## 2023-05-24 DIAGNOSIS — F4323 Adjustment disorder with mixed anxiety and depressed mood: Principal | ICD-10-CM

## 2023-05-24 DIAGNOSIS — F1410 Cocaine abuse, uncomplicated: Secondary | ICD-10-CM | POA: Diagnosis not present

## 2023-05-24 NOTE — Group Note (Signed)
Date:  05/24/2023 Time:  9:03 PM  Group Topic/Focus:  Wrap-Up Group:   The focus of this group is to help patients review their daily goal of treatment and discuss progress on daily workbooks.    Participation Level:  Active  Participation Quality:  Appropriate, Attentive, and Sharing  Affect:  Appropriate  Cognitive:  Alert and Appropriate  Insight: Appropriate  Engagement in Group:  Engaged  Modes of Intervention:  Discussion and Socialization  Additional Comments: pt stated that today was an overall good day.  Pt was able to share that the his goal, was to talk with doctor about current medication concerns. Pt stated that they was able to come to agreement. Feeling hopeful.   Bing Plume D 05/24/2023, 9:03 PM
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
## 2023-05-24 NOTE — Plan of Care (Signed)
  Problem: Education: Goal: Emotional status will improve Outcome: Progressing   Problem: Activity: Goal: Interest or engagement in activities will improve Outcome: Progressing   Problem: Safety: Goal: Periods of time without injury will increase Outcome: Progressing

## 2023-05-24 NOTE — Progress Notes (Signed)
   05/24/23 0610  15 Minute Checks  Location Bedroom  Visual Appearance Calm  Behavior Sleeping  Sleep (Behavioral Health Patients Only)  Calculate sleep? (Click Yes once per 24 hr at 0600 safety check) Yes  Documented sleep last 24 hours 8.5

## 2023-05-24 NOTE — [facility] Group Notes (Signed)
Pt did not participate in emotional wellness group

## 2023-05-24 NOTE — Plan of Care (Signed)
  Problem: Education: Goal: Knowledge of [place] [facility] Education information/materials will improve Outcome: Progressing Goal: Emotional status will improve Outcome: Progressing Goal: Mental status will improve Outcome: Progressing   

## 2023-05-24 NOTE — Progress Notes (Signed)
   05/24/23 1600  Psych Admission Type (Psych Patients Only)  Admission Status Voluntary  Psychosocial Assessment  Patient Complaints None  Eye Contact Fair  Facial Expression Anxious  Affect Anxious  Speech Logical/coherent  Interaction Assertive  Motor Activity Slow  Appearance/Hygiene Unremarkable  Behavior Characteristics Cooperative  Mood Anxious;Pleasant  Thought Process  Coherency WDL  Content WDL  Delusions None reported or observed  Perception WDL  Hallucination None reported or observed  Judgment Impaired  Confusion None  Danger to Self  Current suicidal ideation? Denies  Self-Injurious Behavior No self-injurious ideation or behavior indicators observed or expressed   Danger to Others  Danger to Others None reported or observed

## 2023-05-24 NOTE — Progress Notes (Signed)
   05/23/23 2112  Psych Admission Type (Psych Patients Only)  Admission Status Voluntary  Psychosocial Assessment  Patient Complaints None  Eye Contact Fair  Facial Expression Flat  Affect Anxious  Speech Logical/coherent  Interaction Assertive  Motor Activity Slow  Appearance/Hygiene Unremarkable  Behavior Characteristics Cooperative  Mood Anxious;Pleasant  Thought Process  Coherency WDL  Content WDL  Delusions None reported or observed  Perception WDL  Hallucination None reported or observed  Judgment Impaired  Confusion None  Danger to Self  Current suicidal ideation? Denies  Self-Injurious Behavior No self-injurious ideation or behavior indicators observed or expressed   Agreement Not to Harm Self Yes  Description of Agreement verbal  Danger to Others  Danger to Others None reported or observed

## 2023-05-24 NOTE — [facility] Group Notes (Signed)
Type of Therapy and Topic:  Group Therapy: Self Care  Participation Level:  Active   Description of Group:   In this group, patients shared and discussed the importance of acknowledging the elements in their lives for which they are taking care of their self mentally and how this can positively impact their mood.  The group discussed how bringing the positive elements of their lives to the forefront of their minds can help with recovery from any illness, physical or mental.  An exercise was done as a group in which a list was made for self care items in order to encourage participants to consider other potential positives in their lives.  Therapeutic Goals: Patients will identify one or more item for which they are taking care of themself mentally  Patients will discuss how it is possible to seek self care in even bad situations. Patients will explore other possible items of self care that they could remember.   Summary of Patient Progress:  Patient enjoyed group and was very insightful about self care. Patient share his past and the states he will pray daily and stated that praying has help him get himself on his feet again.   Therapeutic Modalities:   Cognitive Behavioral Therapy MI

## 2023-05-24 NOTE — [facility] Group Notes (Signed)
Pt did not participate in orientation group.

## 2023-05-24 NOTE — Plan of Care (Signed)
Nurse discussed anxiety, depression and coping skills with patient.  

## 2023-05-24 NOTE — [facility] Suicide Risk Assessment (Incomplete)
Suicide Risk Assessment  Discharge Assessment    [facility] Discharge Suicide Risk Assessment   Principal Problem: Adjustment disorder with mixed anxiety and depressed mood Discharge Diagnoses: Principal Problem:   Adjustment disorder with mixed anxiety and depressed mood Active Problems:   Tobacco use disorder   Cocaine abuse (HCC)   Methamphetamine use (HCC)   GAD (generalized anxiety disorder)   PTSD (post-traumatic stress disorder)   Total Time spent with patient: 45 minutes  [name] is a 37 y.o., male with a past psychiatric history significant for MDD and stimulant use disorder (cocaine and methamphetamine) who presents to the [facility] from [facility] Emergency Department for evaluation and management of suicidal ideation.   During the patient's hospitalization, patient had extensive initial psychiatric evaluation, and follow-up psychiatric evaluations every day.  Psychiatric diagnoses provided during this hospitalization: Adjustment disorder with mixed anxiety and depressed mood  Cocaine abuse   Tobacco use disorder   MDD (major depressive disorder), recurrent episode, severe    Methamphetamine use    GAD (generalized anxiety disorder)   PTSD (post-traumatic stress disorder)  Patient's psychiatric medications were adjusted on admission:   Cocaine use disorder, moderate, recent relapse Methamphetamine use  MDD, recurrent severe GAD PTSD The patient was initially started on Cymbalta 30 mg for depression and neuropathic pain, ultimately increased to 90 mg daily.  The day prior to discharge the patient requested that this medication be discontinued as he did not find it beneficial and would not be taking it after discharge.  Per patient's request this medication was stopped. Trazodone 50 mg at bedtime as needed for insomnia was started.  The patient reported excess sedation on this medication and also requested that this not be prescribed  at discharge. Atarax 25 mg 3 times daily as needed for anxiety was initially started.  Was later titrated to 50 mg 3 times daily for improved control of anxiety.  The patient reported this improved addressing his anxiety, but continued to report anxiousness and did not want this medication at discharge.  Patient's care was discussed during the interdisciplinary team meeting every day during the hospitalization.  The patient denies having side effects to prescribed psychiatric medication.  Gradually, patient started adjusting to milieu. The patient was evaluated each day by a clinical provider to ascertain response to treatment. Improvement was noted by the patient's report of decreasing symptoms, improved sleep and appetite, affect, medication tolerance, behavior, and participation in unit programming.  Patient was asked each day to complete a self inventory noting mood, mental status, pain, new symptoms, anxiety and concerns.    Symptoms were reported as significantly decreased or resolved completely by discharge.   During hospital stay patient expressed he had been to through the program before and he is experience that it is a very strict program, he expressed concern that using Cymbalta may cause him to have decreased energy during the day and if he is unable to participate in activities as instructed by the program he may get kicked out.  Based on the above he requested to get off Cymbalta and he reports that he would not need any as needed medication for sleep after discharge given that he will be working all day long and sleep would not be an issue after discharge.  Per chart review and with history information obtained from patient patient diagnosis is more consistent with adjustment disorder with depressed mood related to increased stressors prior to admission as well as drug use relapse, based  on which Cymbalta was discontinued and trazodone as needed for sleep will be discontinued at time of  discharge.  Patient was recommended to follow-up with outpatient psychiatrist after discharge to assess if further need arises, he agrees.  He was also recommended to see a primary care provider after discharge for referral for his complaint of right hand and wrist pain and numbness given x-ray during hospital stay did not display any acute findings. During hospital stay patient expressed significant improvement in mood given the fact that he is in a safe environment and feeling hopeful about going to [facility] program given history of good response to that program in the past and he remained clean after attended the program years ago until he relapsed recently.  He continues to deny any passive or active SI intention or plan and he does not display any aggression or self injures behavior.  He denied any craving to drugs and agreed to comply with recommendation to abstain completely after discharge.  On day of discharge, the patient reports that their mood is stable. The patient denied having suicidal thoughts for more than 48 hours prior to discharge.  Patient denies having homicidal thoughts.  Patient denies having auditory hallucinations.  Patient denies any visual hallucinations or other symptoms of psychosis. The patient was motivated to continue taking medication with a goal of continued improvement in mental health.   The patient reports their target psychiatric symptoms of depression and anxiety responded well to the psychiatric medications, and the patient reports overall benefit other psychiatric hospitalization. Supportive psychotherapy was provided to the patient. The patient also participated in regular group therapy while hospitalized. Coping skills, problem solving as well as relaxation therapies were also part of the unit programming.  Labs were reviewed with the patient, and abnormal results were discussed with the patient.  The patient is able to verbalize their individual safety plan to this  provider.  # It is recommended to the patient to continue psychiatric medications as prescribed, after discharge from the hospital.    # It is recommended to the patient to follow up with your outpatient psychiatric provider and PCP.  # It was discussed with the patient, the impact of alcohol, drugs, tobacco have been there overall psychiatric and medical wellbeing, and total abstinence from substance use was recommended the patient.ed.  # Prescriptions provided or sent directly to preferred pharmacy at discharge. Patient agreeable to plan. Given opportunity to ask questions. Appears to feel comfortable with discharge.    # In the event of worsening symptoms, the patient is instructed to call the crisis hotline, 911 and or go to the nearest ED for appropriate evaluation and treatment of symptoms. To follow-up with primary care provider for other medical issues, concerns and or health care needs  # Patient was discharged [facility] with a plan to follow up as noted below.    Musculoskeletal: Strength & Muscle Tone: within normal limits Gait & Station: normal Patient leans: N/A   Psychiatric Specialty Exam:    General Appearance: appears at stated age, disheveled, swollen left eye   Behavior: irritable   Psychomotor Activity: no psychomotor agitation or retardation noted    Eye Contact: fair  Speech: normal amount, tone, volume and fluency      Mood: depressed  Affect: congruent   Thought Process: linear, goal directed, no circumstantial or tangential thought process noted, no racing thoughts or flight of ideas  Descriptions of Associations: intact    Thought Content Hallucinations: denies AH, VH ,  does not appear responding to stimuli  Delusions: paranoia present regarding gang; denies delusions of control, grandeur, ideas of reference, thought broadcasting, and magical thinking  Suicidal Thoughts: denies SI, intention, plan  Homicidal Thoughts: denies HI, intention, plan     Alertness/Orientation: alert and fully oriented    Insight: limited Judgment: limited   Memory: intact    Executive Functions  Concentration: intact  Attention Span: fair  Recall: intact  Fund of Knowledge: fair   Physical Exam: Physical Exam Vitals and nursing note reviewed.  HENT:     Head: Normocephalic and atraumatic.  Pulmonary:     Effort: Pulmonary effort is normal. No respiratory distress.  Neurological:     General: No focal deficit present.     Mental Status: He is oriented to person, place, and time. Mental status is at baseline.    Review of Systems  All other systems reviewed and are negative.  Blood pressure 118/64, pulse 65, temperature 97.9 F (36.6 C), temperature source Oral, resp. rate 18, height 6\' 1"  (1.854 m), weight 73.5 kg, SpO2 100%. Body mass index is 21.37 kg/m.  Mental Status Per Nursing Assessment::   On Admission:  Suicidal ideation indicated by patient  Demographic factors:  Living alone Current Mental Status:  Suicidal ideation indicated by patient Loss Factors:  Financial problems / change in socioeconomic status Historical Factors:  NA Risk Reduction Factors:  NA   Continued Clinical Symptoms:  Alcohol/Substance Abuse/Dependencies Unstable or Poor Therapeutic Relationship Previous Psychiatric Diagnoses and Treatments  Cognitive Features That Contribute To Risk:  None    Suicide Risk:  Mild: There are no identifiable suicide plans, no associated intent, mild dysphoria and related symptoms, good self-control (both objective and subjective assessment), few other risk factors, and identifiable protective factors, including available and accessible social support.   Follow-up Information     [facility] Follow up.   Specialty: Behavioral Health Why: You may go to this provider for therapy and medication management services.  For fastest service, please go on Monday through Friday, arrive by 7:00 am  for same day service. Contact information: [address] [place] [address] [phone]        [facility], [address] [facility] Follow up.   Contact information: [address] [place] [address] [phone]                 Plan Of Care/Follow-up recommendations:  Activity: as tolerated  Diet: heart healthy  Other: -Follow-up with your outpatient psychiatric provider -instructions on appointment date, time, and address (location) are provided to you in discharge paperwork.  -Take your psychiatric medications as prescribed at discharge - instructions are provided to you in the discharge paperwork  -Follow-up with outpatient primary care doctor and other specialists -for management of preventative medicine and chronic medical disease, including:   Wrist pain and numbness -XR hand and wrist negative for fractures, but identified a triquetral avulsion injury - PCP referral for follow up  -Testing: Follow-up with outpatient provider for abnormal lab results: Lab Results  Component Value Date   HCVAB Reactive (A) 05/20/2023   -Recommend abstinence from alcohol, tobacco, and other illicit drug use at discharge.   -If your psychiatric symptoms recur, worsen, or if you have side effects to your psychiatric medications, call your outpatient psychiatric provider, 911, [phone] or go to the nearest emergency department.  -If suicidal thoughts recur, call your outpatient psychiatric provider, 911, [phone] or go to the nearest emergency department.  Lorri Frederick, MD 05/24/2023, 2:17 PM

## 2023-05-25 DIAGNOSIS — F1410 Cocaine abuse, uncomplicated: Secondary | ICD-10-CM | POA: Diagnosis not present

## 2023-05-25 NOTE — Progress Notes (Signed)
  [facility] Adult Case Management Discharge Plan :  Will you be returning to the same living situation after discharge:  No. Pt will be discharged to [facility] in [facility] for treatment. At discharge, do you have transportation home?: Yes,  CSW to arrange transportation to [facility] for 1:30PM. Do you have the ability to pay for your medications: No.  Release of information consent forms completed and in the chart;  Patient's signature needed at discharge.  Patient to Follow up at:  Follow-up Information     [facility] Follow up.   Specialty: Behavioral Health Why: You may go to this provider for therapy and medication management services when you complete the [facility] program. Call or go to this addres Monday through Friday by 7:00 am for same day service. Contact information: [address] [place] [address] [phone]        [facility]. Schedule an appointment as soon as possible for a visit.   Why: Please call this provider after you finish treatment at [facility] to obtain appointment for primary care. Contact information: [address] [place] [address] [phone]        [facility]. Go today.   Why: You will be discharged from [facility] to [facility] in [facility] for a 2 year program for treatment. Contact information: Address: [address] [place], [place], [place] [address]  Phone: [phone]                Next level of care provider has access to [facility] Link:no  Safety Planning and Suicide Prevention discussed: No. Pt declined consents.    Has patient been referred to the Quitline?: Yes, pt going to treatment at [facility]  Patient has been referred for addiction treatment: Yes, pt will be discharged to [facility] in [facility] for substance use treatment.   Kathi Der, LCSWA 05/25/2023, 9:27 AM

## 2023-05-25 NOTE — Progress Notes (Signed)
   05/24/23 2230  Psych Admission Type (Psych Patients Only)  Admission Status Voluntary  Psychosocial Assessment  Patient Complaints Anxiety  Eye Contact Fair  Facial Expression Animated  Affect Anxious  Speech Logical/coherent  Interaction Assertive  Motor Activity Slow  Appearance/Hygiene Unremarkable  Behavior Characteristics Cooperative  Mood Anxious;Pleasant  Thought Process  Coherency WDL  Content WDL  Delusions None reported or observed  Perception WDL  Hallucination None reported or observed  Judgment Impaired  Confusion None  Danger to Self  Current suicidal ideation? Denies  Self-Injurious Behavior No self-injurious ideation or behavior indicators observed or expressed   Agreement Not to Harm Self Yes  Description of Agreement verbal  Danger to Others  Danger to Others None reported or observed

## 2023-05-25 NOTE — Plan of Care (Signed)
  Problem: Activity: Goal: Interest or engagement in activities will improve Outcome: Progressing   Problem: Health Behavior/Discharge Planning: Goal: Compliance with treatment plan for underlying cause of condition will improve Outcome: Progressing   Problem: Safety: Goal: Periods of time without injury will increase Outcome: Progressing   

## 2023-05-25 NOTE — Progress Notes (Signed)
   05/25/23 0600  15 Minute Checks  Location Bedroom  Visual Appearance Calm  Behavior Sleeping  Sleep (Behavioral Health Patients Only)  Calculate sleep? (Click Yes once per 24 hr at 0600 safety check) Yes  Documented sleep last 24 hours 8.75

## 2023-05-25 NOTE — Transportation (Signed)
05/25/2023  [name] DOB: 16-Jun-1986 MRN: [id number]   RIDER WAIVER AND RELEASE OF LIABILITY  For the purposes of helping with transportation needs, [facility] partners with outside transportation providers (taxi companies, [place], Catering manager.) to give [employer] patients or other approved people the choice of on-demand rides [employer]") to our buildings for non-emergency visits.  By using [employer], I, the person signing this document, on behalf of myself and/or any legal minors (in my care using the [employer]), agree:  Science writer given to me are supplied by independent, outside transportation providers who do not work for, or have any affiliation with, [employer]. [facility] is not a transportation company. [facility] [facility] has no control over the quality or safety of the rides I get using [employer]. [facility] has no control over whether any outside ride will happen on time or not. [facility] gives no guarantee on the reliability, quality, safety, or availability on any rides, or that no mistakes will happen. I know and accept that traveling by vehicle (car, truck, SVU, Zenaida Niece, bus, taxi, etc.) has risks of serious injuries such as disability, being paralyzed, and death. I know and agree the risk of using [employer] is mine alone, and not [employer]. Transport Services are provided "as is" and as are available. The transportation providers are in charge for all inspections and care of the vehicles used to provide these rides. I agree not to take legal action against [facility], its agents, employees, officers, directors, representatives, insurers, attorneys, assigns, successors, subsidiaries, and affiliates at any time for any reasons related directly or indirectly to using [employer]. I also agree not to take legal action against [facility] or its affiliates for any injury, death, or damage to property caused by or related to  using [employer]. I have read this Waiver and Release of Liability, and I understand the terms used in it and their legal meaning. This Waiver is freely and voluntarily given with the understanding that my right (or any legal minors) to legal action against [facility] relating to [employer] is knowingly given up to use these services.   I attest that I read the Ride Waiver and Release of Liability to [name], gave Mr. Outten the opportunity to ask questions and answered the questions asked (if any). I affirm that [name] then provided consent for assistance with transportation.
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
## 2023-05-25 NOTE — Progress Notes (Signed)
I assumed care for [name] at about 08:00.  He was resting in bed, awake,in no apparent distress. Denied any new pain, denied any avh/hi/si at the time of my assessment. Vital signs WNL. He attended groups and all scheduled unit activities. He was discharged from the unit at about 13:45, verbalized understanding of discharge instructions, did not have any questions/concerns at the time of discharge.

## 2023-05-25 NOTE — Group Note (Signed)
Date:  05/25/2023 Time:  11:45 AM  Group Topic/Focus:  Orientation:   The focus of this group is to educate the patient on the purpose and policies of crisis stabilization and provide a format to answer questions about their admission.  The group details unit policies and expectations of patients while admitted.    Participation Level:  Did Not Attend  Participation Quality:      Affect:      Cognitive:      Insight: None  Engagement in Group:  Modes of Intervention:      Additional Comments:     Reymundo Poll 05/25/2023, 11:45 AM

## 2023-05-25 NOTE — Group Note (Unsigned)
Date:  05/25/2023 Time:  11:22 AM  Group Topic/Focus:  Goals Group:   The focus of this group is to help patients establish daily goals to achieve during treatment and discuss how the patient can incorporate goal setting into their daily lives to aide in recovery.     Participation Level:  {BHH PARTICIPATION ZOXWR:60454}  Participation Quality:  {BHH PARTICIPATION QUALITY:22265}  Affect:  {BHH AFFECT:22266}  Cognitive:  {BHH COGNITIVE:22267}  Insight: {BHH Insight2:20797}  Engagement in Group:  {BHH ENGAGEMENT IN UJWJX:91478}  Modes of Intervention:  {BHH MODES OF INTERVENTION:22269}  Additional Comments:  ***  Reymundo Poll 05/25/2023, 11:22 AM

## 2023-05-25 NOTE — Group Note (Signed)
Recreation Therapy Group Note   Group Topic:Team Building  Group Date: 05/25/2023 Start Time: 0943 End Time: 1018 Facilitators: Naiomi Musto-McCall, LRT,CTRS Location: [address] [employer] Therapy Notes  Group Topic: Communication, Team Building, Problem Solving  Goal Area(s) Addresses:  Patient will effectively work with peer towards shared goal.  Patient will identify skills used to make activity successful.  Patient will identify how skills used during activity can be applied to reach post d/c goals.   Intervention: STEM Activity- Glass blower/designer  Group Description: Tallest Pharmacist, community. In teams of 5-6, patients were given 11 craft pipe cleaners. Using the materials provided, patients were instructed to compete again the opposing team(s) to build the tallest free-standing structure from floor level. The activity was timed; difficulty increased by Clinical research associate as Production designer, theatre/television/film continued.  Systematically resources were removed with additional directions for example, placing one arm behind their back, working in silence, and shape stipulations. LRT facilitated post-activity discussion reviewing team processes and necessary communication skills involved in completion. Patients were encouraged to reflect how the skills utilized, or not utilized, in this activity can be incorporated to positively impact support systems post discharge.  Education: Pharmacist, community, Scientist, physiological, Discharge Planning   Education Outcome: Acknowledges education/In group clarification offered/Needs additional education.    Affect/Mood: N/A   Participation Level: Did not attend    Clinical Observations/Individualized Feedback:     Plan: Continue to engage patient in RT group sessions 2-3x/week.   Reedy Biernat-McCall, LRT,CTRS  05/25/2023 12:18 PM
